# Patient Record
Sex: Male | Born: 1985 | Hispanic: No | Marital: Married | Smoking: Never smoker
Health system: Southern US, Community
[De-identification: ages and names within clinical notes are randomized; demographics above are authoritative.]

## PROBLEM LIST (undated history)

## (undated) DIAGNOSIS — D689 Coagulation defect, unspecified: Secondary | ICD-10-CM

## (undated) DIAGNOSIS — E079 Disorder of thyroid, unspecified: Secondary | ICD-10-CM

## (undated) DIAGNOSIS — T7840XA Allergy, unspecified, initial encounter: Secondary | ICD-10-CM

## (undated) HISTORY — DX: Coagulation defect, unspecified: D68.9

## (undated) HISTORY — DX: Disorder of thyroid, unspecified: E07.9

## (undated) HISTORY — DX: Allergy, unspecified, initial encounter: T78.40XA

---

## 2015-12-18 ENCOUNTER — Ambulatory Visit
Admission: RE | Admit: 2015-12-18 | Discharge: 2015-12-18 | Disposition: A | Discharge status: Home / Self Care | Payer: No Typology Code available for payment source | Source: Ambulatory Visit | Attending: Infectious Disease | Admitting: Infectious Disease

## 2015-12-18 ENCOUNTER — Other Ambulatory Visit: Payer: Self-pay | Admitting: Infectious Disease

## 2015-12-18 DIAGNOSIS — R7611 Nonspecific reaction to tuberculin skin test without active tuberculosis: Secondary | ICD-10-CM

## 2016-12-02 ENCOUNTER — Other Ambulatory Visit: Payer: Self-pay | Admitting: Nurse Practitioner

## 2016-12-02 DIAGNOSIS — R2232 Localized swelling, mass and lump, left upper limb: Secondary | ICD-10-CM

## 2016-12-06 ENCOUNTER — Other Ambulatory Visit: Payer: Self-pay

## 2018-04-02 ENCOUNTER — Ambulatory Visit: Payer: Self-pay | Admitting: Gastroenterology

## 2018-06-10 ENCOUNTER — Encounter: Payer: Self-pay | Admitting: Family

## 2019-06-03 ENCOUNTER — Ambulatory Visit: Payer: Self-pay | Attending: Family

## 2019-06-03 DIAGNOSIS — Z23 Encounter for immunization: Secondary | ICD-10-CM

## 2019-06-03 NOTE — Progress Notes (Signed)
   Covid-19 Vaccination Clinic  Name:  Lanard Arguijo    MRN: [id number] DOB: [birth year]  06/03/2019  Mr. Seals was observed post Covid-19 immunization for 15 minutes without incident. He was provided with Vaccine Information Sheet and instruction to access the V-Safe system.   Mr. Skalicky was instructed to call 911 with any severe reactions post vaccine: Marland Kitchen Difficulty breathing  . Swelling of face and throat  . A fast heartbeat  . A bad rash all over body  . Dizziness and weakness   Immunizations Administered    Name Date Dose VIS Date Route   Moderna COVID-19 Vaccine 06/03/2019  1:49 PM 0.5 mL 02/23/2019 Intramuscular   Manufacturer: Moderna   Lot: [id number]   NDC: 63016-010-93

## 2019-07-06 ENCOUNTER — Other Ambulatory Visit: Payer: Self-pay

## 2019-07-06 ENCOUNTER — Ambulatory Visit: Payer: Self-pay | Attending: Family

## 2019-07-06 DIAGNOSIS — Z23 Encounter for immunization: Secondary | ICD-10-CM

## 2019-07-06 NOTE — Progress Notes (Signed)
   Covid-19 Vaccination Clinic  Name:  Robert Macias    MRN: [id number] DOB: 06-07-1985  07/06/2019  [name] was observed post Covid-19 immunization for 15 minutes without incident. He was provided with Vaccine Information Sheet and instruction to access the V-Safe system.   [name] was instructed to call 911 with any severe reactions post vaccine: Marland Kitchen Difficulty breathing  . Swelling of face and throat  . A fast heartbeat  . A bad rash all over body  . Dizziness and weakness   Immunizations Administered    Name Date Dose VIS Date Route   Moderna COVID-19 Vaccine 07/06/2019  1:49 PM 0.5 mL 02/23/2019 Intramuscular   Manufacturer: Moderna   Lot: [id number]   NDC: 76151-834-37

## 2020-08-24 ENCOUNTER — Other Ambulatory Visit: Payer: Self-pay

## 2020-08-24 ENCOUNTER — Other Ambulatory Visit: Payer: Self-pay | Admitting: Internal Medicine

## 2020-08-24 ENCOUNTER — Ambulatory Visit
Admission: RE | Admit: 2020-08-24 | Discharge: 2020-08-24 | Disposition: A | Discharge status: Home / Self Care | Payer: BC Managed Care – PPO | Source: Ambulatory Visit | Attending: Internal Medicine | Admitting: Internal Medicine

## 2020-08-24 DIAGNOSIS — R7612 Nonspecific reaction to cell mediated immunity measurement of gamma interferon antigen response without active tuberculosis: Secondary | ICD-10-CM

## 2021-12-29 IMAGING — CR DG CHEST 2V
2 series · 2 of 2 positions shown · non-contrast
Comparison: Prior chest x-ray 12/18/2015

CLINICAL DATA: Positive TB blood test

EXAM:
CHEST - 2 VIEW

[w chest pa]
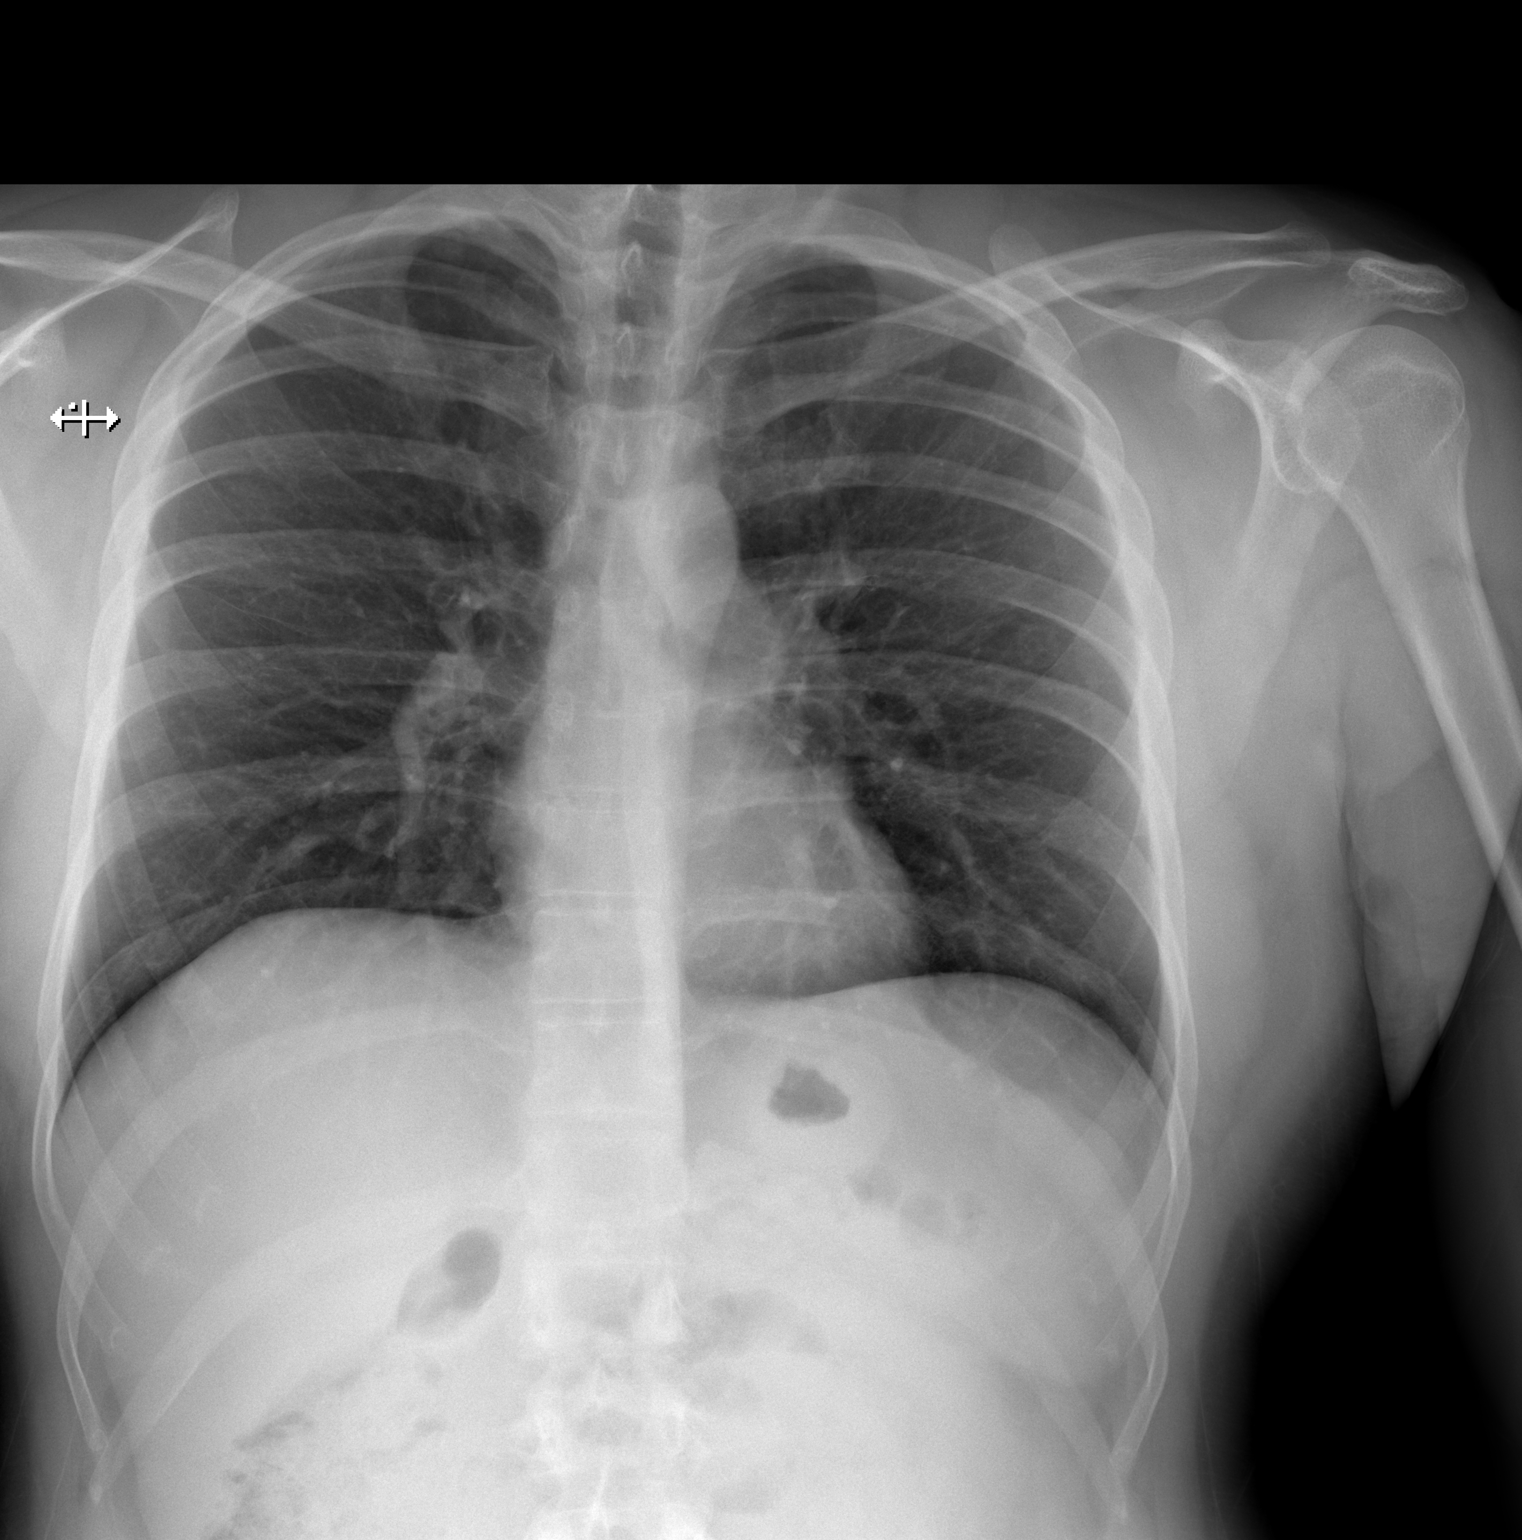

[w chest lat]
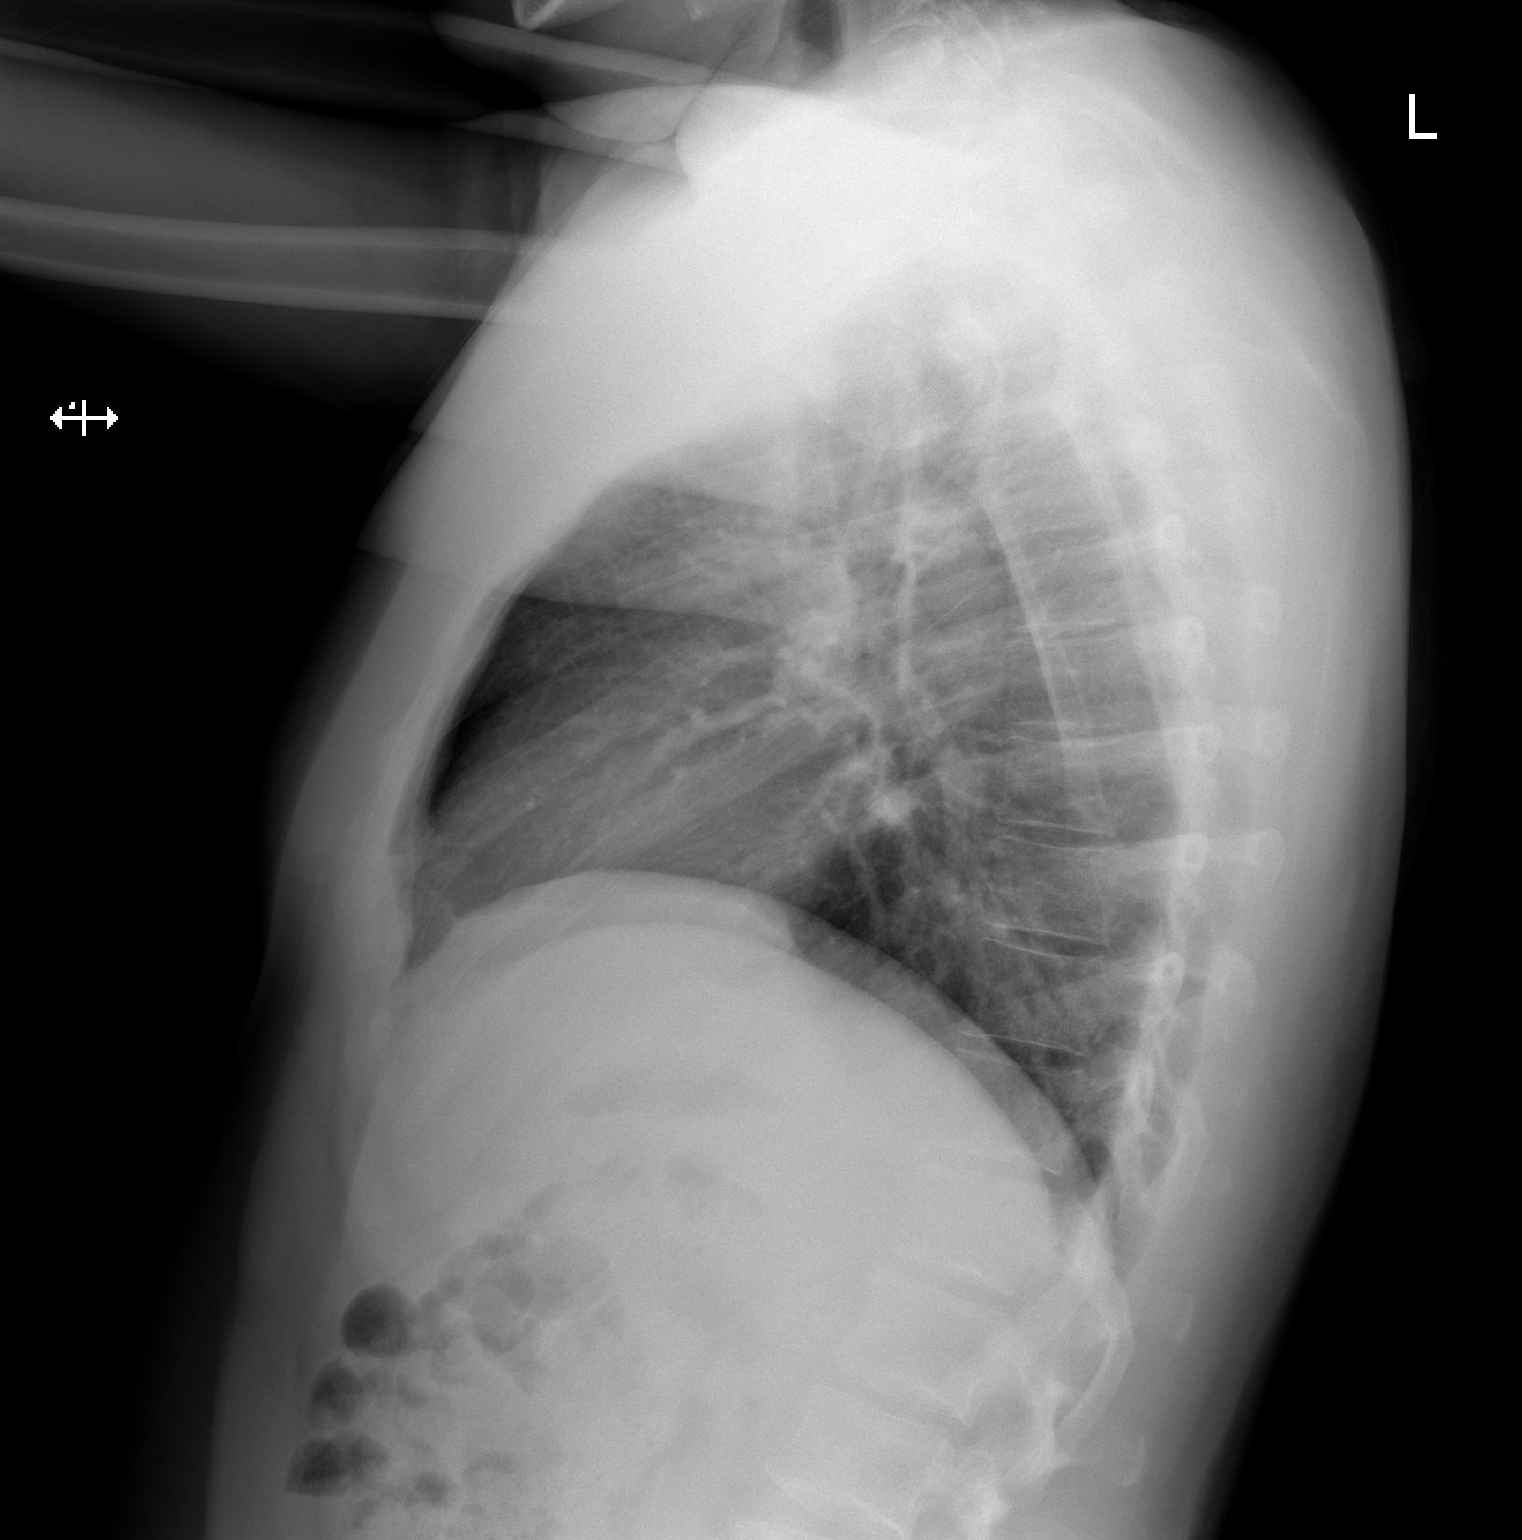

[2 of 2 positions shown; findings below may reference images not displayed]

FINDINGS: The lungs are clear and negative for focal airspace consolidation,
pulmonary edema or suspicious pulmonary nodule. No pleural effusion
or pneumothorax. Cardiac and mediastinal contours are within normal
limits. No acute fracture or lytic or blastic osseous lesions. The
visualized upper abdominal bowel gas pattern is unremarkable.
IMPRESSION: Normal chest x-ray. No imaging evidence of active or latent TB
infection.

## 2022-01-22 ENCOUNTER — Ambulatory Visit (HOSPITAL_BASED_OUTPATIENT_CLINIC_OR_DEPARTMENT_OTHER): Payer: BC Managed Care – PPO | Admitting: Nurse Practitioner

## 2022-01-28 ENCOUNTER — Encounter: Payer: Self-pay | Admitting: Internal Medicine

## 2022-01-28 ENCOUNTER — Ambulatory Visit (INDEPENDENT_AMBULATORY_CARE_PROVIDER_SITE_OTHER): Payer: 59 | Admitting: Internal Medicine

## 2022-01-28 VITALS — BP 115/79 | HR 96 | Temp 97.9°F | Resp 14 | Ht 69.0 in | Wt 199.2 lb

## 2022-01-28 DIAGNOSIS — G8929 Other chronic pain: Secondary | ICD-10-CM

## 2022-01-28 DIAGNOSIS — M25572 Pain in left ankle and joints of left foot: Secondary | ICD-10-CM | POA: Insufficient documentation

## 2022-01-28 DIAGNOSIS — Z8719 Personal history of other diseases of the digestive system: Secondary | ICD-10-CM

## 2022-01-28 DIAGNOSIS — E663 Overweight: Secondary | ICD-10-CM

## 2022-01-28 DIAGNOSIS — L649 Androgenic alopecia, unspecified: Secondary | ICD-10-CM | POA: Diagnosis not present

## 2022-01-28 DIAGNOSIS — E039 Hypothyroidism, unspecified: Secondary | ICD-10-CM | POA: Diagnosis not present

## 2022-01-28 HISTORY — DX: Personal history of other diseases of the digestive system: Z87.19

## 2022-01-28 HISTORY — DX: Androgenic alopecia, unspecified: L64.9

## 2022-01-28 LAB — COMPREHENSIVE METABOLIC PANEL
ALT: 21 U/L (ref 0–53)
AST: 22 U/L (ref 0–37)
Albumin: 4.6 g/dL (ref 3.5–5.2)
Alkaline Phosphatase: 51 U/L (ref 39–117)
BUN: 15 mg/dL (ref 6–23)
CO2: 28 mEq/L (ref 19–32)
Calcium: 9.7 mg/dL (ref 8.4–10.5)
Chloride: 101 mEq/L (ref 96–112)
Creatinine, Ser: 1.12 mg/dL (ref 0.40–1.50)
GFR: 83.47 mL/min (ref >60.00)
Glucose, Bld: 83 mg/dL (ref 70–99)
Potassium: 4.1 mEq/L (ref 3.5–5.1)
Sodium: 136 mEq/L (ref 135–145)
Total Bilirubin: 0.6 mg/dL (ref 0.2–1.2)
Total Protein: 7.7 g/dL (ref 6.0–8.3)

## 2022-01-28 LAB — CBC
HCT: 42.4 % (ref 39.0–52.0)
Hemoglobin: 14.7 g/dL (ref 13.0–17.0)
MCHC: 34.7 g/dL (ref 30.0–36.0)
MCV: 86.3 fl (ref 78.0–100.0)
Platelets: 170 10*3/uL (ref 150.0–400.0)
RBC: 4.91 Mil/uL (ref 4.22–5.81)
RDW: 12.9 % (ref 11.5–15.5)
WBC: 6.3 10*3/uL (ref 4.0–10.5)

## 2022-01-28 LAB — TSH: TSH: 1.81 u[IU]/mL (ref 0.35–5.50)

## 2022-01-28 LAB — T4, FREE: Free T4: 1.02 ng/dL (ref 0.60–1.60)

## 2022-01-28 NOTE — Progress Notes (Signed)
[facility] ---   Phone: [phone]  New patient visit  Visit Date: 01/28/2022 Patient: Robert Macias   DOB: Feb 23, 1986   36 y.o. Male  MRN: [id number]   Today's healthcare provider: Lula Olszewski, MD  Assessment and Plan:   Nicolas was seen today for establish care, wants to check  thyroxine levels, left ankle pain, discuss gi issue and alopecia.  Acquired hypothyroidism Overview: Started levothyroxine 2013 for elevated TSH of unknown cause.  Orders: -     TSH; Future -     T4, free  Chronic pain of left ankle Overview: On May 2023 he had an inversion sprain of the left ankle which left him with pain on the lateral malleolus that slowly resolved with rest but then was followed with inferior medial malleolus pain that is worsened by weightbearing and associated with numbness along the of the great toe  Assessment & Plan: We discussed the I do not think an x-ray is likely to add much value and that I think probably just physical therapy and B12 is the best path and since he is on the high deductible plan I will just give him some handouts on the kind of physical therapy to do on his own but if he decides to get the vasectomy and colonoscopy then he can call and I will make an official physical therapy referral to get more tailored treatment I also suggest vit d and calcium supplementation.   History of gastrointestinal bleeding Overview: Seen by GI in march 2023, recommended to have colonoscopy Problem went away, it was expensive and he is on high deductible plan   Assessment & Plan: We discussed the GI bleeding is not happening very often and so I explained the signs and symptoms of anemia that he would have to go to the ER which would be very expensive and he will will try to get his insurance changed so that we never end up in that situation and he can get an outpatient colonoscopy  Discuss it more with spouse and we talked about the insurance  issues really the only thing holding it up - he will call   Orders: -     CBC; Future -     Comprehensive metabolic panel; Future  Male pattern alopecia Assessment & Plan: We discussed 5 minutes benefits / side fx rogaine and propecia.    Overweight Overview: Encouraged diet/exercise. Goal bmi under 25 Goal waist circumference: 32-34.    Health Maintenance  Topic Date Due   HIV Screening  Never done   Hepatitis C Screening  Never done   TETANUS/TDAP  Never done   COVID-19 Vaccine (3 - Moderna series) 08/31/2019   INFLUENZA VACCINE  Never done   HPV VACCINES  Aged Out  Did not order HIV/hep c due to low risk and he is on high deductible health plan.  Subjective:  Patient presents today to establish care.  [facility]  Chief Complaint  Patient presents with   Establish Care   Wants to check  thyroxine levels    Medication dosage may need to be adjusted.   Left ankle pain    Mild pain for about the last four or five months.   Discuss GI issue    Intermittent, bleeding in stools.   Alopecia    Thinks it's related to the levothyroxine medication.    For history taking, I took a per problem history from the patient and chart review  as follows: Problem  Hypothyroid   Started levothyroxine 2013 for elevated TSH of unknown cause.   Left Ankle Pain   On May 2023 he had an inversion sprain of the left ankle which left him with pain on the lateral malleolus that slowly resolved with rest but then was followed with inferior medial malleolus pain that is worsened by weightbearing and associated with numbness along the of the great toe   History of Gastrointestinal Bleeding   Seen by GI in march 2023, recommended to have colonoscopy Problem went away, it was expensive and he is on high deductible plan    Male Pattern Alopecia  Overweight   Encouraged diet/exercise. Goal bmi under 25 Goal waist circumference: 32-34.      Depression  Screen     No data to display         No results found for any visits on 01/28/22.   The following were reviewed and entered/updated in epic: Past Medical History:  Diagnosis Date   Allergy pollen allergies   Eve irritation   Clotting disorder (HCC) 2010   Hemorrhoid   History of gastrointestinal bleeding 01/28/2022   Seen by GI in march 2023, recommended to have colonoscopy Problem went away, it was expensive and he is on high deductible plan    Male pattern alopecia 01/28/2022   Thyroid disease    History reviewed. No pertinent surgical history. History reviewed. No pertinent surgical history. Family Status  Relation Name Status   Mother Mom (Not Specified)   Father dad (Not Specified)   Son Son (Not Specified)   Mat Aunt mom's sister (Not Specified)   Mat Uncle Mom's brother (Not Specified)   MGM Grand mom (Not Specified)   MGF Grand Dad (Not Specified)   PGM Grand Mom (Not Specified)   PGF  (Not Specified)   Family History  Problem Relation Age of Onset   Hyperlipidemia Mother    Heart disease Mother    Congenital heart disease Mother    Hearing loss Father    Kidney disease Father    Learning disabilities Son    Diabetes Maternal Aunt    Stroke Maternal Uncle    Diabetes Maternal Grandmother    Cancer Maternal Grandfather    Stroke Paternal Grandmother    Kidney disease Paternal Grandfather    Outpatient Medications Prior to Visit  Medication Sig Dispense Refill   levothyroxine (SYNTHROID) 88 MCG tablet Take 88 mcg by mouth daily before breakfast.     No facility-administered medications prior to visit.    Allergies  Allergen Reactions   Advil [Ibuprofen]     Affects ability to sleep.   Social History   Tobacco Use   Smoking status: Never   Smokeless tobacco: Never   Tobacco comments:    never  Vaping Use   Vaping Use: Never used  Substance Use Topics   Alcohol use: Never   Drug use: Never    Immunization History  Administered Date(s)  Administered   Moderna Sars-Covid-2 Vaccination 06/03/2019, 07/06/2019    Review of Systems  Constitutional:  Negative for chills, diaphoresis, fever, malaise/fatigue and weight loss.  HENT:  Negative for congestion, ear discharge, ear pain, hearing loss, nosebleeds, sinus pain, sore throat and tinnitus.   Eyes:  Negative for blurred vision, double vision, photophobia, pain, discharge and redness.  Respiratory:  Negative for cough, hemoptysis, sputum production, shortness of breath, wheezing and stridor.   Cardiovascular:  Negative for chest pain, palpitations, orthopnea, claudication, leg swelling  and PND.  Gastrointestinal:  Positive for blood in stool. Negative for abdominal pain, constipation, diarrhea, heartburn, melena, nausea and vomiting.  Genitourinary:  Negative for dysuria, flank pain, frequency, hematuria and urgency.  Musculoskeletal:  Positive for joint pain. Negative for back pain, falls, myalgias and neck pain.  Skin:  Negative for itching and rash.  Neurological:  Negative for dizziness, tingling, tremors, sensory change, speech change, focal weakness, seizures, loss of consciousness, weakness and headaches.  Endo/Heme/Allergies:  Negative for environmental allergies and polydipsia. Does not bruise/bleed easily.  Psychiatric/Behavioral:  Negative for depression, hallucinations, memory loss, substance abuse and suicidal ideas. The patient is not nervous/anxious and does not have insomnia.      Objective:  BP 115/79 (BP Location: Left Arm, Patient Position: Sitting)   Pulse 96   Temp 97.9 F (36.6 C) (Temporal)   Resp 14   Ht 5\' 9"  (1.753 m)   Wt 199 lb 3.2 oz (90.4 kg)   SpO2 98%   BMI 29.42 kg/m  Body mass index is 29.42 kg/m.  He  is a very cordial and polite person who was a pleasure to meet.   Physical Exam Constitutional:      General: He is awake. He is not in acute distress.    Appearance: Normal appearance. He is well-developed. He is not ill-appearing,  toxic-appearing or diaphoretic.  HENT:     Head: Normocephalic and atraumatic.     Nose: Nose normal.  Eyes:     General: Lids are normal. No scleral icterus.    Conjunctiva/sclera: Conjunctivae normal.  Cardiovascular:     Rate and Rhythm: Normal rate and regular rhythm.     Heart sounds: No murmur heard.    No friction rub. No gallop.  Pulmonary:     Effort: Pulmonary effort is normal. No respiratory distress or retractions.     Breath sounds: Normal breath sounds.  Abdominal:     General: There is no distension.     Palpations: There is no mass.     Tenderness: There is no abdominal tenderness.  Neurological:     General: No focal deficit present.     Mental Status: He is alert.  Psychiatric:        Mood and Affect: Mood normal.        Behavior: Behavior normal.        Thought Content: Thought content normal.        Judgment: Judgment normal.

## 2022-01-28 NOTE — Assessment & Plan Note (Signed)
We discussed the GI bleeding is not happening very often and so I explained the signs and symptoms of anemia that he would have to go to the ER which would be very expensive and he will will try to get his insurance changed so that we never end up in that situation and he can get an outpatient colonoscopy  Discuss it more with spouse and we talked about the insurance issues really the only thing holding it up - he will call

## 2022-01-28 NOTE — Assessment & Plan Note (Signed)
We discussed 5 minutes benefits / side fx rogaine and propecia.

## 2022-01-28 NOTE — Progress Notes (Signed)
Everything normal notify.  Therefore I recommend staying on same dose synthroid, please refill 90d 3rf for him.  Happy all labs look good.  Balding is definitely not limited to a thyroid issue... its genetic.

## 2022-01-28 NOTE — Patient Instructions (Addendum)
It was a pleasure seeing you today! I truly hope you feel like you received 5 star service and please let me know if there is anything I can improve.  Lula Olszewski, MD   Today the plan is... Have a talk with your spouse about your plan for contraception and GI bleeding work-up and if you decide you want to call back for a urology referral and a gastroenterology referral  We will check Lab work and check on your thyroid and some other stuff make sure you are not severely anemic and communicate with you about the thyroid medicine by MyChart   Also monitor yourself for signs and symptoms of anemia with the following data and try the ankle exercises you would also be okay to try an ankle brace or to take weight off the ankle for a little while to see if that helps or take ibuprofen now and then.  Now that you have been diagnosed with worsening anemia, it's important to be aware of the following symptoms that may indicate a need for immediate medical attention:  Extreme fatigue or weakness Shortness of breath Rapid or irregular heartbeat Chest pain Dizziness or lightheadedness Pale or yellowish skin Cold hands and feet Headaches  Emergency symptoms specific to anemia include: A feeling that you're going to pass out Low blood pressure Low blood oxygen saturation, which may be measured with a pulse oximeter at home3 Loss of consciousness Please advise the patient to seek emergency care if they experience any of these symptoms, or any other symptoms that cause concern.  It's always better to be safe and get checked out by a healthcare professional if you are unsure.    When to recheck the anemia depends on how you are feeling... but if you feel fine and dont see any significant bleeding, then I'd suggest doing it in about a month.  In the meantime be sure to consistently check toilet to assess your bleeding and discuss whether to take blood thinners with me.      Ankle Exercises Ask  your health care provider which exercises are safe for you. Do exercises exactly as told by your health care provider and adjust them as directed. It is normal to feel mild stretching, pulling, tightness, or discomfort as you do these exercises. Stop right away if you feel sudden pain or your pain gets worse. Do not begin these exercises until told by your health care provider. Stretching and range-of-motion exercises These exercises warm up your muscles and joints. They can help improve the movement and flexibility of your ankle. They may also help to relieve pain. Dorsiflexion/plantar flexion  Sit with your left / right knee straight or bent. Do not rest your foot on anything. Flex your left / right ankle to tilt the top of your foot toward your shin. This is called dorsiflexion. Hold this position for __________ seconds. Point your toes downward to tilt the top of your foot away from your shin. This is called plantar flexion. Hold this position for __________ seconds. Repeat __________ times. Complete this exercise __________ times a day. Ankle alphabet  Sit with your left / right foot supported at your lower leg. Do not rest your foot on anything. Make sure your foot has room to move freely. Think of your left / right foot as a paintbrush: Move your foot to trace each letter of the alphabet in the air. Keep your hip and knee still while you trace the letters. Make the letters as  large as you can without causing or increasing any discomfort. Repeat __________ times. Complete this exercise __________ times a day. Passive ankle dorsiflexion This is an exercise in which something or someone moves your ankle for you. Sit in a chair on a non-carpeted surface. Place your left / right foot on the floor, directly under your left / right knee. Extend your left / right leg for support. Keeping your heel down, slide your left / right foot back toward the chair until you feel a stretch at your ankle  or calf. If you do not feel a stretch, slide your buttocks forward to the edge of the chair while keeping your heel down. Hold this stretch for __________ seconds. Repeat __________ times. Complete this exercise __________ times a day. Strengthening exercises These exercises build strength and endurance in your ankle. Endurance is the ability to use your muscles for a long time, even after they get tired. Dorsiflexors These are muscles that lift your foot up. Secure a rubber exercise band or tube to an object, such as a table leg, that will stay still when the band is pulled. Secure the other end around your left / right foot. Sit on the floor. Face the object with your left / right leg extended. The band or tube should be slightly tense when your foot is relaxed. Slowly flex your left / right ankle and toes to bring your foot toward your shin. Hold this position for __________ seconds. Slowly return your foot to the starting position, controlling the band as you do. Repeat __________ times. Complete this exercise __________ times a day. Plantar flexors These are muscles that push your foot down. Sit on the floor with your left / right leg extended. Loop a rubber exercise band or tube around the ball of your left / right foot. The ball of your foot is on the walking surface, right under your toes. The band or tube should be slightly tense when your foot is relaxed. Slowly point your toes downward, pushing them away from you. Hold this position for __________ seconds. Slowly release the tension in the band or tube, controlling smoothly until your foot is back in the starting position. Repeat __________ times. Complete this exercise __________ times a day. Towel curls  Sit in a chair on a non-carpeted surface. Put your feet on the floor. Place a towel in front of your feet. If told by your health care provider, add a __________ lb / kg weight to the end of the towel. Keeping your heel on the  floor, put your left / right foot on the towel. Pull the towel toward you by grabbing the towel with your toes and curling them under. Keep your heel on the floor. Let your toes relax. Grab the towel again. Keep pulling the towel until it is completely underneath your foot. Repeat __________ times. Complete this exercise __________ times a day. Standing plantar flexion This is an exercise in which you use your toes to lift your body's weight while standing. Stand with your feet shoulder-width apart. Keep your weight spread evenly over the width of your feet while you rise up on your toes. Use a wall or table to steady yourself if needed, but try not to use it for support. If this exercise is too easy, try these options: Shift your weight toward your left / right leg until you feel challenged. If told by your health care provider, lift your uninjured leg off the floor. Hold this position for  __________ seconds. Repeat __________ times. Complete this exercise __________ times a day. Tandem walking  Stand with one foot directly in front of the other. Slowly raise your back foot up, lifting your heel before your toes, and place it directly in front of your other foot. Continue to walk in this heel-to-toe way for __________ or for as long as told by your health care provider. Have a countertop or wall nearby to use if needed to keep your balance, but try not to hold onto anything for support. Repeat __________ times. Complete this exercise __________ times a day. This information is not intended to replace advice given to you by your health care provider. Make sure you discuss any questions you have with your health care provider. Document Revised: 06/19/2021 Document Reviewed: 06/19/2021 Elsevier Patient Education  [place].    There are no diagnoses linked to this encounter.     [x]  No follow-ups on file.   [x]  If your condition begins to worsen or become severe:  GO to the  ER  [x]  If you are not doing well: RETURN to the office sooner  [x]  If you have follow-up questions / concerns: please contact me  -via phone [phone] OR MyChart messaging   [x]  Please bring all your medicines to each appointment  [x]   You will be contacted with the lab results as soon as they are available. For any labs or imaging tests, we will call you if the results are significantly abnormal.  Most normal results will be posted to myChart as soon as they are available and I will comment on them there within 2-3 business days.  [x]  Billing questions for xray and lab orders are billed from separate companies and questions/concerns should be directed to the [facility].  For visit charges please discuss with our administrative services.  [x]  You will be contacted with the lab results as soon as they are available. The fastest way to get your results is to activate your My Chart account. Instructions are located on the last page of this paperwork. If you have not heard from [place] regarding the results in 2 weeks, please contact this office.

## 2022-01-28 NOTE — Assessment & Plan Note (Signed)
We discussed the I do not think an x-ray is likely to add much value and that I think probably just physical therapy and B12 is the best path and since he is on the high deductible plan I will just give him some handouts on the kind of physical therapy to do on his own but if he decides to get the vasectomy and colonoscopy then he can call and I will make an official physical therapy referral to get more tailored treatment I also suggest vit d and calcium supplementation.

## 2022-01-30 ENCOUNTER — Other Ambulatory Visit: Payer: Self-pay

## 2022-01-30 ENCOUNTER — Telehealth: Payer: Self-pay | Admitting: Internal Medicine

## 2022-01-30 NOTE — Telephone Encounter (Signed)
Pt called back re lab results - patient does not have vm set up - office can call him anytime after 2 pm today -

## 2022-02-04 ENCOUNTER — Other Ambulatory Visit: Payer: Self-pay

## 2022-02-04 MED ORDER — LEVOTHYROXINE SODIUM 88 MCG PO TABS: 88.0000 ug | ORAL_TABLET | Freq: Every day | ORAL | 3 refills | Status: DC

## 2022-02-04 NOTE — Telephone Encounter (Signed)
Spoke with pt and advised lab results, pt verbalized understanding.  

## 2022-03-07 ENCOUNTER — Encounter: Payer: Self-pay | Admitting: *Deleted

## 2022-08-02 ENCOUNTER — Ambulatory Visit (INDEPENDENT_AMBULATORY_CARE_PROVIDER_SITE_OTHER): Payer: 59 | Admitting: Internal Medicine

## 2022-08-02 ENCOUNTER — Encounter: Payer: Self-pay | Admitting: Internal Medicine

## 2022-08-02 VITALS — BP 123/84 | HR 95 | Temp 98.2°F | Ht 69.0 in | Wt 196.4 lb

## 2022-08-02 DIAGNOSIS — R6889 Other general symptoms and signs: Secondary | ICD-10-CM | POA: Diagnosis not present

## 2022-08-02 DIAGNOSIS — R051 Acute cough: Secondary | ICD-10-CM

## 2022-08-02 DIAGNOSIS — J029 Acute pharyngitis, unspecified: Secondary | ICD-10-CM

## 2022-08-02 DIAGNOSIS — U071 COVID-19: Secondary | ICD-10-CM

## 2022-08-02 DIAGNOSIS — R0981 Nasal congestion: Secondary | ICD-10-CM | POA: Diagnosis not present

## 2022-08-02 HISTORY — DX: COVID-19: U07.1

## 2022-08-02 LAB — POC COVID19 BINAXNOW: SARS Coronavirus 2 Ag: POSITIVE — AB

## 2022-08-02 LAB — POCT INFLUENZA A/B
Influenza A, POC: NEGATIVE
Influenza B, POC: NEGATIVE

## 2022-08-02 LAB — POCT RAPID STREP A (OFFICE): Rapid Strep A Screen: POSITIVE — AB

## 2022-08-02 MED ORDER — NIRMATRELVIR/RITONAVIR (PAXLOVID)TABLET: 3.0000 | ORAL_TABLET | Freq: Two times a day (BID) | ORAL | 0 refills | Status: AC

## 2022-08-02 NOTE — Patient Instructions (Signed)
It was a pleasure seeing you today! Your health and satisfaction are our top priorities.   Glenetta Hew, MD  Next Steps:  [x]  Flexible Follow-Up: We recommend a follow up with your primary care, a specialist, or me within 1-2 weeks for ensuring your problem resolves. This allows for progress monitoring and treatment adjustments. [x]  Early Intervention: Schedule sooner appointment, call our on-call services, or go to emergency room if there is Increase in pain or discomfort New or worsening symptoms Sudden or severe changes in your health [x]  Lab & X-ray Appointments: complete or schedule to complete today, or call to schedule.  X-rays: [facility] (M-F, 8:30am-noon or 1pm-5pm).  Making the Most of Our Focused (20 minute) Follow Up Appointments:  [x]   Clearly state your top concerns at the beginning of the visit to focus our discussion [x]   If you anticipate you will need more time, please inform the front desk during scheduling - we can book multiple appointments in the same week. [x]   If you have transportation problems- use our convenient video appointments or ask about transportation support. [x]   We can get down to business faster if you use MyChart to update information before the visit and submit non-urgent questions before your visit. Thank you for taking the time to provide details through MyChart.  Let our nurse know and she can import this information into your encounter documents.  Arrival and Wait Times: [x]   Arriving on time ensures that everyone receives prompt attention. [x]   Early morning (8a) and afternoon (1p) appointments tend to have shortest wait times. [x]   Unfortunately, we cannot delay appointments for late arrivals or hold slots during phone calls.  Getting Answers and Following Up  [x]   Simple Questions & Concerns: For quick questions or basic follow-up after your visit, reach [place] at [phone] or MyChart messaging. [x]   Complex Concerns: If  your concern is more complex, scheduling an appointment might be best. Discuss this with the staff to find the most suitable option. [x]   Lab & Imaging Results: We'll contact you directly if results are abnormal or you don't use MyChart. Most normal results will be on MyChart within 2-3 business days, with a review message from Dr. Jon Billings. Haven't heard back in 2 weeks? Need results sooner? Contact us at [phone]. [x]   Referrals: Our referral coordinator will manage specialist referrals. The specialist's office should contact you within 2 weeks to schedule an appointment. Call us if you haven't heard from them after 2 weeks.  Staying Connected  [x]   MyChart: Activate your MyChart for the fastest way to access results and message [place]. See the last page of this paperwork for instructions on how to activate.  Bring to Your Next Appointment  [x]   Medications: Please bring all your medication bottles to your next appointment to ensure we have an accurate record of your prescriptions. [x]   Health Diaries: If you're monitoring any health conditions at home, keeping a diary of your readings can be very helpful for discussions at your next appointment.  Billing  [x]   X-ray & Lab Orders: These are billed by separate companies. Contact the invoicing company directly for questions or concerns. [x]   Visit Charges: Discuss any billing inquiries with our administrative services team.  Your Satisfaction Matters  [x]   Share Your Experience: We strive for your satisfaction! If you have any complaints, or preferably compliments, please let Dr. Jon Billings know directly or contact our Practice Administrators, Edwena Felty or [employer], by  asking at the front desk.   Reviewing Your Records  [x]   Review this early draft of your clinical encounter notes below and the final encounter summary tomorrow on MyChart after its been completed.   Sore throat -     POCT rapid strep A  Flu-like symptoms -      POCT Influenza A/B  Acute cough -     POC COVID-19 BinaxNow  Nasal congestion -     POC COVID-19 BinaxNow  COVID -     nirmatrelvir/ritonavir; Take 3 tablets by mouth 2 (two) times daily for 5 days. (Take nirmatrelvir 150 mg two tablets twice daily for 5 days and ritonavir 100 mg one tablet twice daily for 5 days) Patient GFR is over 60  Dispense: 30 tablet; Refill: 0

## 2022-08-02 NOTE — Assessment & Plan Note (Addendum)
Assessment: The patient has tested positive for COVID-19 with a strong positive result. A faint positive result for strep was also observed, but given the strong positive result for COVID-19 and our office's history of frequent false-positive strep rapid tests, this was dismissed as a likely false positive.  Plan: We discussed the potential use of Paxlovid as a treatment option and provided the patient with After Visit Summary (AVS) data. We also discussed the importance of quarantining to prevent the spread of the virus.   The patient was advised to contact the on-call healthcare provider if their condition does not improve soon, as there is still a possibility of a strep infection, albeit doubtful.   We also discussed recent changes to the CDC guidelines. The patient was given a note for a day off work. If the patient recovers well, I advised that I would release them to return to work without requiring a repeat visit.

## 2022-08-02 NOTE — Progress Notes (Signed)
[facility]: [phone]   Routine Medical Office Visit  Patient:  Robert Macias      Age: 37 y.o.       Sex:  male  Date:   08/02/2022 PCP:    Lula Olszewski, MD   Today's Healthcare Provider: Lula Olszewski, MD   Assessment and Plan:   COVID Assessment & Plan: Assessment: The patient has tested positive for COVID-19 with a strong positive result. A faint positive result for strep was also observed, but given the strong positive result for COVID-19 and our office's history of frequent false-positive strep rapid tests, this was dismissed as a likely false positive.  Plan: We discussed the potential use of Paxlovid as a treatment option and provided the patient with After Visit Summary (AVS) data. We also discussed the importance of quarantining to prevent the spread of the virus.   The patient was advised to contact the on-call healthcare provider if their condition does not improve soon, as there is still a possibility of a strep infection, albeit doubtful.   We also discussed recent changes to the CDC guidelines. The patient was given a note for a day off work. If the patient recovers well, I advised that I would release them to return to work without requiring a repeat visit.  Orders: -     nirmatrelvir/ritonavir; Take 3 tablets by mouth 2 (two) times daily for 5 days. (Take nirmatrelvir 150 mg two tablets twice daily for 5 days and ritonavir 100 mg one tablet twice daily for 5 days) Patient GFR is over 60  Dispense: 30 tablet; Refill: 0  Sore throat -     POCT rapid strep A  Flu-like symptoms -     POCT Influenza A/B  Acute cough -     POC COVID-19 BinaxNow  Nasal congestion -     POC COVID-19 BinaxNow    Treatment plan discussed and reviewed in detail. Explained medication safety and potential side effects. Agreed on patient returning to office if symptoms worsen, persist, or new symptoms develop. Discussed precautions in case of needing to  visit the Emergency Department. Answered all patient questions and confirmed understanding and comfort with the plan. Encouraged patient to contact our office if they have any questions or concerns.      Clinical Presentation:     37 y.o. male here today for Fever (No fever currently. Symptoms started Monday or Tuesday. Took Tylenol with relief from fever only. ), Sore Throat, Cough (Producing small amount of yellowish mucus sometimes.), Nasal Congestion (Runny nose and sneezing.), Generalized Body Aches, and Chills  All symptom(s) in last 3-4 days  Fever  This is a new problem. The current episode started in the past 7 days. The problem has been waxing and waning. Associated symptoms include coughing.  Risk factors: sick contacts (all his kids have)   Sore Throat  Associated symptoms include coughing.  Cough The problem has been waxing and waning. The cough is Non-productive. Associated symptoms include a fever.  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   No facility-administered medications prior to visit.         Clinical Data Analysis:    Physical Exam  BP 123/84 (BP Location: Left Arm, Patient Position: Sitting)   Pulse 95   Temp 98.2 F (36.8 C) (Temporal)   Ht 5\' 9"  (1.753 m)   Wt 196 lb 6.4 oz (89.1 kg)   SpO2 96%   BMI  29.00 kg/m  Wt Readings from Last 10 Encounters:  08/02/22 196 lb 6.4 oz (89.1 kg)  01/28/22 199 lb 3.2 oz (90.4 kg)   Vital signs reviewed.  Nursing notes reviewed. Weight trend reviewed. Abnormalities and Problem-Specific physical exam findings:  wearing mask, no coughing and breathing pattern normal.  No sniffling during visit.  No diaphoresis.  Doesn't appear that ill.  General Appearance:  No acute distress appreciable.   Well-groomed, healthy-appearing male.  Well proportioned with no abnormal fat distribution.  Good muscle tone. Skin: Clear and well-hydrated. Pulmonary:  Normal work of breathing at rest, no respiratory distress apparent. SpO2: 96 %  Musculoskeletal: All extremities are intact.  Neurological:  Awake, alert, oriented, and engaged.  No obvious focal neurological deficits or cognitive impairments.  Sensorium seems unclouded.   Speech is clear and coherent with logical content. Psychiatric:  Appropriate mood, pleasant and cooperative demeanor, thoughtful and engaged during the exam   Additional Results Reviewed:     Results for orders placed or performed in visit on 08/02/22  POCT rapid strep A  Result Value Ref Range   Rapid Strep A Screen Positive (A) Negative  POC COVID-19  Result Value Ref Range   SARS Coronavirus 2 Ag Positive (A) Negative  POCT Influenza A/B  Result Value Ref Range   Influenza A, POC Negative Negative   Influenza B, POC Negative Negative    Recent Results (from the past 2160 hour(s))  POCT Influenza A/B     Status: Normal   Collection Time: 08/02/22 10:49 AM  Result Value Ref Range   Influenza A, POC Negative Negative   Influenza B, POC Negative Negative  POC COVID-19     Status: Abnormal   Collection Time: 08/02/22 10:50 AM  Result Value Ref Range   SARS Coronavirus 2 Ag Positive (A) Negative  POCT rapid strep A     Status: Abnormal   Collection Time: 08/02/22 10:50 AM  Result Value Ref Range   Rapid Strep A Screen Positive (A)  Negative    No image results found.   No results found.     Signed: Lula Olszewski, MD 08/02/2022 3:14 PM

## 2022-08-07 ENCOUNTER — Telehealth: Payer: Self-pay | Admitting: Internal Medicine

## 2022-08-07 ENCOUNTER — Encounter: Payer: Self-pay | Admitting: Internal Medicine

## 2022-08-09 NOTE — Telephone Encounter (Signed)
error 

## 2023-02-05 ENCOUNTER — Encounter: Payer: Self-pay | Admitting: Internal Medicine

## 2023-02-05 ENCOUNTER — Ambulatory Visit (INDEPENDENT_AMBULATORY_CARE_PROVIDER_SITE_OTHER): Payer: 59 | Admitting: Internal Medicine

## 2023-02-05 VITALS — BP 100/84 | HR 81 | Temp 98.0°F | Ht 69.0 in | Wt 203.0 lb

## 2023-02-05 DIAGNOSIS — E785 Hyperlipidemia, unspecified: Secondary | ICD-10-CM

## 2023-02-05 DIAGNOSIS — Z0001 Encounter for general adult medical examination with abnormal findings: Secondary | ICD-10-CM

## 2023-02-05 DIAGNOSIS — E663 Overweight: Secondary | ICD-10-CM | POA: Diagnosis not present

## 2023-02-05 DIAGNOSIS — K649 Unspecified hemorrhoids: Secondary | ICD-10-CM

## 2023-02-05 DIAGNOSIS — E65 Localized adiposity: Secondary | ICD-10-CM | POA: Insufficient documentation

## 2023-02-05 DIAGNOSIS — R1084 Generalized abdominal pain: Secondary | ICD-10-CM

## 2023-02-05 DIAGNOSIS — Z0000 Encounter for general adult medical examination without abnormal findings: Secondary | ICD-10-CM

## 2023-02-05 DIAGNOSIS — C4499 Other specified malignant neoplasm of skin, unspecified: Secondary | ICD-10-CM

## 2023-02-05 DIAGNOSIS — E039 Hypothyroidism, unspecified: Secondary | ICD-10-CM

## 2023-02-05 DIAGNOSIS — Z23 Encounter for immunization: Secondary | ICD-10-CM

## 2023-02-05 DIAGNOSIS — R41840 Attention and concentration deficit: Secondary | ICD-10-CM | POA: Insufficient documentation

## 2023-02-05 DIAGNOSIS — L649 Androgenic alopecia, unspecified: Secondary | ICD-10-CM | POA: Diagnosis not present

## 2023-02-05 DIAGNOSIS — Z8719 Personal history of other diseases of the digestive system: Secondary | ICD-10-CM | POA: Diagnosis not present

## 2023-02-05 DIAGNOSIS — G8929 Other chronic pain: Secondary | ICD-10-CM | POA: Insufficient documentation

## 2023-02-05 LAB — COMPREHENSIVE METABOLIC PANEL
ALT: 28 U/L (ref 0–53)
AST: 24 U/L (ref 0–37)
Albumin: 4.5 g/dL (ref 3.5–5.2)
Alkaline Phosphatase: 51 U/L (ref 39–117)
BUN: 15 mg/dL (ref 6–23)
CO2: 29 meq/L (ref 19–32)
Calcium: 9.6 mg/dL (ref 8.4–10.5)
Chloride: 102 meq/L (ref 96–112)
Creatinine, Ser: 1.24 mg/dL (ref 0.40–1.50)
GFR: 73.35 mL/min (ref >60.00)
Glucose, Bld: 92 mg/dL (ref 70–99)
Potassium: 3.8 meq/L (ref 3.5–5.1)
Sodium: 137 meq/L (ref 135–145)
Total Bilirubin: 0.6 mg/dL (ref 0.2–1.2)
Total Protein: 7.9 g/dL (ref 6.0–8.3)

## 2023-02-05 LAB — CBC WITH DIFFERENTIAL/PLATELET
Basophils Absolute: 0 10*3/uL (ref 0.0–0.1)
Basophils Relative: 0.8 % (ref 0.0–3.0)
Eosinophils Absolute: 0.2 10*3/uL (ref 0.0–0.7)
Eosinophils Relative: 3.8 % (ref 0.0–5.0)
HCT: 43.4 % (ref 39.0–52.0)
Hemoglobin: 15 g/dL (ref 13.0–17.0)
Lymphocytes Relative: 40.1 % (ref 12.0–46.0)
Lymphs Abs: 2.1 10*3/uL (ref 0.7–4.0)
MCHC: 34.6 g/dL (ref 30.0–36.0)
MCV: 87.1 fL (ref 78.0–100.0)
Monocytes Absolute: 0.3 10*3/uL (ref 0.1–1.0)
Monocytes Relative: 6.3 % (ref 3.0–12.0)
Neutro Abs: 2.6 10*3/uL (ref 1.4–7.7)
Neutrophils Relative %: 49 % (ref 43.0–77.0)
Platelets: 188 10*3/uL (ref 150.0–400.0)
RBC: 4.98 Mil/uL (ref 4.22–5.81)
RDW: 12.7 % (ref 11.5–15.5)
WBC: 5.3 10*3/uL (ref 4.0–10.5)

## 2023-02-05 LAB — LIPID PANEL
Cholesterol: 191 mg/dL (ref 0–200)
HDL: 34.7 mg/dL — ABNORMAL LOW (ref >39.00)
LDL Cholesterol: 135 mg/dL — ABNORMAL HIGH (ref 0–99)
NonHDL: 156.25
Total CHOL/HDL Ratio: 6
Triglycerides: 108 mg/dL (ref 0.0–149.0)
VLDL: 21.6 mg/dL (ref 0.0–40.0)

## 2023-02-05 LAB — HEMOGLOBIN A1C: Hgb A1c MFr Bld: 5.4 % (ref 4.6–6.5)

## 2023-02-05 NOTE — Assessment & Plan Note (Signed)
Was  He experiences intermittent rectal bleeding during defecation and has been diagnosed with hemorrhoids by gastroenterology. We discussed management options including a high-fiber diet and provided handouts on managing hemorrhoids. We offered a referral to a hemorrhoid surgeon if symptoms worsen.

## 2023-02-05 NOTE — Patient Instructions (Addendum)
Managing Your Health Over Time  Managing every aspect of your health in a single visit isn't always feasible, but that's okay.  We addressed many preventive issues today and charted a course for future care. Acute conditions or preventive care measures may require further attention.  We encourage you to schedule a follow-up visit at your earliest convenience to discuss any unresolved issues.  We strongly encourage continued participation in annual preventive care visits to help [place] develop a more thorough understanding of your health and to help you maintain optimal wellness - please inquire about scheduling your next one with [place] at your earliest convenience.   VISIT SUMMARY:  During your annual check-up, we discussed various health concerns and preventive measures. You reported occasional abdominal pain, rectal bleeding, and concentration issues. We also reviewed your thyroid management and skin lesions. We discussed lifestyle habits and recommended screenings for potential health issues.  YOUR PLAN:  -ANNUAL PHYSICAL EXAM: Regular check-ups are essential for maintaining overall health. We discussed the importance of regular dental and eye exams, and recommended using saline nasal spray to reduce the risk of allergies and infections.  -SNORING AND POSSIBLE SLEEP APNEA: Snoring and occasional daytime fatigue may indicate sleep-disordered breathing, which can affect your overall health. We recommended using the [facility] app for self-assessment and considering a sleep study if needed. Daily exercise and a heart-healthy diet were also advised.  -THYROID HORMONE MANAGEMENT: Thyroid hormone levels need to be managed to prevent symptoms like weight gain and hair loss. We will order advanced thyroid testing and discussed potential hair replacement therapies.  -HEMORRHOIDS: Hemorrhoids are swollen veins in the lower rectum that can cause bleeding. We recommended a high-fiber diet and provided information on  managing hemorrhoids. A referral to a hemorrhoid surgeon was offered if symptoms worsen.  -FATTY LIVER SCREENING: Fatty liver disease is the buildup of fat in the liver, often related to diet and lifestyle. We will order an abdominal ultrasound and liver function tests to screen for fatty liver and evaluate your abdominal pain.  -CONCENTRATION ISSUES: Difficulty concentrating can be related to stress or other conditions like attention deficit disorder (ADD). We discussed the potential for ADD evaluation and encouraged using the [facility] app to rule out sleep-related causes.  -GENERAL HEALTH MAINTENANCE: Maintaining good health involves regular exercise, a heart-healthy diet, and avoiding smoking, vaping, and drug use. We recommended a tetanus vaccination and discussed vasectomy as a long-term contraceptive option.  INSTRUCTIONS:  Please schedule your dental and eye exams as planned. We will follow up with advanced thyroid testing, an abdominal ultrasound, and liver function tests. Use the [facility] app to monitor your snoring. If your symptoms worsen or new concerns arise, please schedule a follow-up appointment. Your next annual check-up is already scheduled.  Next Steps  [x]   Schedule Follow-Up:  We recommend a follow-up appointment in 1 year for your next wellness visit.  If you develop any new problems, want to address any medical issues, or your condition worsens before then, please call us for an appointment or seek emergency care. [x]   Preventive Care:  Make sure to keep regular appointments with dental and vision professionals, use nightly nasal saline mist sprays to keep your sinuses clear and toothbrushing to protect your teeth. Use [facility] App or other app to track your sleep quality. Check blood pressure and heart rate routinely. [x]   Medical Information Release:  For any relevant medical information we don't have, please sign a release form at the front desk  so we can obtain it for  your records. [x]   Lab Tests:  Schedule any lab tests from today for within a week to ensure best insurance coverage.    Making the Most of Our Focused (20 minute) Appointments:  [x]   Clearly state your top concerns at the beginning of the visit to focus our discussion [x]   If you anticipate you will need more time, please inform the front desk during scheduling - we can book multiple appointments in the same week. [x]   If you have transportation problems- use our convenient video appointments or ask about transportation support. [x]   We can get down to business faster if you use MyChart to update information before the visit and submit non-urgent questions before your visit. Thank you for taking the time to provide details through MyChart.  Let our nurse know and she can import this information into your encounter documents.  Arrival and Wait Times: [x]   Arriving on time ensures that everyone receives prompt attention. [x]   Early morning (8a) and afternoon (1p) appointments tend to have shortest wait times. [x]   Unfortunately, we cannot delay appointments for late arrivals or hold slots during phone calls.  Bring to Your Next Appointment  [x]   Medications: Please bring all your medication bottles to your next appointment to ensure we have an accurate record of your prescriptions. [x]   Health Diaries: If you're monitoring any health conditions at home, keeping a diary of your readings can be very helpful for discussions at your next appointment.  Reviewing Your Records  [x]   Review your attached preventive care information at the end of these patient instructions. [x]   Review this early draft of your clinical encounter notes below and the final encounter summary tomorrow on MyChart after its been completed.   Encounter for annual health examination  Encounter for annual general medical examination with abnormal findings in adult  Preventative health care  Healthcare  maintenance  Overweight  Male pattern alopecia  Acquired hypothyroidism  History of gastrointestinal bleeding  Need for influenza vaccination -     Flu vaccine trivalent PF, 6mos and older(Flulaval,Afluria,Fluarix,Fluzone)  Bleeding hemorrhoid  Abdominal pain, chronic, generalized  Central adiposity     Getting Answers and Following Up  [x]   Simple Questions & Concerns: For quick questions or basic follow-up after your visit, reach [place] at [phone] or MyChart messaging. [x]   Complex Concerns: If your concern is more complex, scheduling an appointment might be best. Discuss this with the staff to find the most suitable option. [x]   Lab & Imaging Results: We'll contact you directly if results are abnormal or you don't use MyChart. Most normal results will be on MyChart within 2-3 business days, with a review message from Dr. Jon Billings. Haven't heard back in 2 weeks? Need results sooner? Contact us at [phone]. [x]   Referrals: Our referral coordinator will manage specialist referrals. The specialist's office should contact you within 2 weeks to schedule an appointment. Call us if you haven't heard from them after 2 weeks.  Staying Connected  [x]   MyChart: Activate your MyChart for the fastest way to access results and message [place]. See the last page of this paperwork for instructions on how to activate.  Billing  [x]   X-ray & Lab Orders: These are billed by separate companies. Contact the invoicing company directly for questions or concerns. [x]   Visit Charges: Discuss any billing inquiries with our administrative services team.  Your Satisfaction Matters  [x]   Share Your Experience: We strive  for your satisfaction! If you have any complaints, or preferably compliments, please let Dr. Jon Billings know directly or contact our Practice Administrators, Edwena Felty or [employer], by asking at the front desk.

## 2023-02-05 NOTE — Progress Notes (Signed)
[facility]: [phone]   -- Annual Preventive Medical Office Visit --  Patient:  Robert Macias      Age: 37 y.o.       Sex:  male  Date:   02/05/2023 Patient Care Team: Lula Olszewski, MD as PCP - General (Internal Medicine) Today's Healthcare Provider: Lula Olszewski, MD  ========================================= Chief Complaint  Patient presents with  . Annual Exam   Purpose of Visit: Comprehensive preventive health assessment and personalized health maintenance planning.  This encounter was conducted as a Comprehensive Physical Exam (CPE) preventive care annual visit. The patient's medical history and problem list were reviewed to inform individualized preventive care recommendations.     Assessment & Plan Encounter for annual health examination Annual Physical Exam   During his routine annual physical examination, we discussed the importance of regular check-ups with ophthalmologists and dentists. We recommended using saline nasal spray for sinus rinsing to reduce the risk of allergies and infections.  General Health Maintenance   We recommended a tetanus vaccination, regular exercise, and a heart-healthy diet. We advised against smoking, vaping, and drug use. We discussed vasectomy as a long-term contraceptive option, including its lower risk compared to male sterilization and insurance coverage.  Follow-up   We scheduled his next annual follow-up and encouraged follow-up if any symptoms worsen or new concerns arise.  Encounter for annual general medical examination with abnormal findings in adult See extensive AVS info given. Preventative health care  Healthcare maintenance  Overweight  Fatty Liver Screening   He expressed concern about potential fatty liver disease due to dietary habits and occasional mild abdominal pain. We discussed the importance of staying lean and the role of exercise and diet in preventing fatty liver disease. We  will order an abdominal ultrasound for screening of fatty liver and evaluation of abdominal pain and check liver function tests in blood work. Male pattern alopecia  Acquired hypothyroidism Thyroid Hormone Management   We are managing his ongoing thyroid hormone levels, with symptoms including slight weight gain and hair loss but no palpitations. We discussed the potential benefits of hair replacement therapies and hair grafting. We will order advanced thyroid testing.  We will adjust medication dosing pending results. Interim history: denies fatigue,  heat/cold intolerance, bowel/skin changes or CVS symptoms    Latest Ref Rng & Units 01/28/2022   11:22 AM  THYROID  TSH 0.35 - 5.50 uIU/mL 1.81   T4,Free(Direct) 0.60 - 1.60 ng/dL 4.01     I don't have data yet for: anti-TPO antibodies: N/A  anti-Tg antibodies: N/A  Anti-TSI antibodies: N/A   Family history of thyroid disease: Last ultrasound for thyroid nodules: N/A  Last heart exam for rhythm check:   History of gastrointestinal bleeding Was  He experiences intermittent rectal bleeding during defecation and has been diagnosed with hemorrhoids by gastroenterology. We discussed management options including a high-fiber diet and provided handouts on managing hemorrhoids. We offered a referral to a hemorrhoid surgeon if symptoms worsen. Need for influenza vaccination  Bleeding hemorrhoid  Abdominal pain, chronic, generalized  Central adiposity  Concentration deficit Concentration Issues   He reports occasional difficulty concentrating, possibly related to stress. We discussed potential attention deficit disorder (ADD) and its impact on work performance, mentioning the cost of evaluation and the benefits of treatment if diagnosed. We will consider evaluation for ADD if symptoms persist and encouraged the use of the Snore Lab app to rule out sleep-related causes of concentration issues. 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 Per patient report these have been stable long term so will document here and monitor for stability Photographs Taken 02/05/2023 :     Dyslipidemia (high LDL; low HDL)       Encounter Orders:         Ordered    Flu vaccine trivalent PF, 6mos and older(Flulaval,Afluria,Fluarix,Fluzone)        02/05/23 0943    Lipid panel       Comments: Specimen [id number]: [place]    02/05/23 1006    Comprehensive metabolic panel        02/05/23 1006    CBC with Differential/Platelet        02/05/23 1006    US Abdomen Complete        02/05/23 1006    HgB A1c        02/05/23 1006    TSH + free T4        02/05/23 1006               Today's Health Maintenance Counseling and Anticipatory Guidance:  Eye exams:  We encouraged patient to to complete eye exam every 1-2 years.  He reports his last eye exam was:  not doing  Dental health:  He reports he has not been keeping up with his dental visits.  He intends to do so in the future.  We encourage regular tooth brushing/flossing. Sinus health:  We encouraged sterile saline nasal misting sinus rinses daily for pollen, to reduce allergies and risk for sinus infections.   Sterile can based misting products are recommended due to superior misting and ease of maintaining sterility Sleep Apnea screening:  We encourage self monitoring of sleep quality with SnoreLab App and other tools/apps that are now available at retail [x]  Do you snore loudly [x]  Tired, fatigued, or sleepy during the daytime []  Witness apneas []  Significant hypertension []  BMI greater than 35    []  Age older than 39 years old []  Has large neck size over 15.7 in [x]  Male Total Score: 3 SnoreLab App   Cardiovascular Risk Factor Reduction:   Advised patient of need for regular exercise and diet rich and fruits and vegetables and healthy fats to reduce risk of heart attack and stroke.  Avoid first- and second-hand smoke and stimulants.   Avoid extreme exercise- exercise in  moderation (150 minutes per week is a good goal) Discussed health benefits of physical activity, and encouraged him to engage in regular exercise appropriate for his age and condition. Exercise Activities and Dietary recommendations  Goals   None    Wt Readings from Last 3 Encounters:  02/05/23 203 lb (92.1 kg)  08/02/22 196 lb 6.4 oz (89.1 kg)  01/28/22 199 lb 3.2 oz (90.4 kg)  Body mass index is 29.98 kg/m. Health maintenance and immunizations reviewed and he was encouraged to complete anything that is due: Immunization History  Administered Date(s) Administered  . Influenza, Seasonal, Injecte, Preservative Fre 02/05/2023  . Moderna Sars-Covid-2 Vaccination 06/03/2019, 07/06/2019   Health Maintenance Due  Topic Date Due  . DTaP/Tdap/Td (1 - Tdap) Never done    Health Maintenance  Topic Date Due  . DTaP/Tdap/Td (1 - Tdap) Never done  . COVID-19 Vaccine (3 - 2023-24 season) 02/21/2023 (Originally 11/24/2022)  . INFLUENZA VACCINE  Completed  . HPV VACCINES  Aged Out  . Hepatitis C Screening  Discontinued  . HIV Screening  Discontinued   Did flu but holding off on tetanus. Lifestyle / Social  History Reviewed:  Social History   Tobacco Use  . Smoking status: Never  . Smokeless tobacco: Never  . Tobacco comments:    never  Vaping Use  . Vaping status: Never Used  Substance Use Topics  . Alcohol use: Never  . Drug use: Never    My recommendation is total abstinence from all substances of abuse including smoke and 2nd hand smoke, alcohol, illicit drugs, smoking, inhalants, sugar, high risk sexual behavior Offered to assist with any use disorders or addictions.   Sexual transmitted infection testing offered today, but patient declined as he feels he is low risk based on his sexual history.   Social History   Substance and Sexual Activity  Sexual Activity Yes  . Birth control/protection: Condom  .      02/05/2023    9:16 AM  Depression screen PHQ 2/9  Decreased Interest 0   Down, Depressed, Hopeless 0  PHQ - 2 Score 0  Altered sleeping 0  Tired, decreased energy 2  Change in appetite 0  Feeling bad or failure about yourself  0  Trouble concentrating 2  Moving slowly or fidgety/restless 0  Suicidal thoughts 0  PHQ-9 Score 4  Difficult doing work/chores Not difficult at all    Today's Cancer Screening  Penile/Testicle/Scrotum cancer screening: Asked the patient to self-monitor for genital tumors. Patient reports there are none. Thyroid cancer screening: patient advised to check by palpating thyroid for nodules Prostate cancer screening:  Denies family history of prostate cancer or hematospermia so too young for screening by current guidelines.(No results found for: "PSA") Colon cancer screening:    Denies strong family history of colon cancer or blood in stool so no screening is indicated until age 40.   Lung cancer screening:  Current guidelines recommend Individuals aged 83 to 88 who currently smoke or formerly smoked and have a >= 20 pack-year smoking history should undergo annual screening with low-dose computed tomography (LDCT). Skin cancer screening: Advised regular sunscreen use. He denies worrisome, changing, or new skin lesions. Showed him pictures of melanomas for reference:   Return to care in 1 year for next preventative visit.      Subjective  37 y.o. male presents today for a complete physical exam.  He reports consuming a general diet. The patient does not participate in regular exercise at present. He generally feels well. He reports sleeping fairly well. He does have additional problems to discuss today: he needs me to manage his hypothyroidism prescription   HPI  AI-Extracted: Discussed the use of AI scribe software for clinical note transcription with the patient, who gave verbal consent to proceed.  History of Present Illness   The patient, with a history of thyroid disease, presented for an annual check-up. He reported no new health  issues since the last visit. The patient mentioned occasional abdominal pain, which was not severe and resolved spontaneously. He expressed concern about potential fatty liver disease due to dietary habits in his [place] country and requested screening.  The patient also reported occasional rectal bleeding, which he attributed to hemorrhoids. The bleeding was not associated with any discomfort or other symptoms. He expressed concern about the bleeding but was not interested in surgical intervention at this time.  The patient also reported occasional difficulty with concentration and forgetfulness. He was unsure if this was related to his thyroid condition or another health issue. He was considering further evaluation for attention deficit disorder but had not made a decision at the time of  the visit.  The patient also had two skin lesions, one on his back and one on his left arm. He reported that these had been present for a long time and had not changed significantly. They were not associated with any discomfort or other symptoms.  The patient was managing his thyroid condition with medication and reported no issues with this treatment. He was also taking steps to maintain a healthy lifestyle, including using olive oil in his diet and considering exercise for weight management. He was not currently interested in weight loss medication.  The patient was due for dental and eye exams and planned to schedule these. He had recently received a flu shot and was considering a tetanus shot. He was not currently using any tobacco products or illicit drugs. He was using condoms for contraception and was not planning to have more children at this time. He was not interested in a vasectomy.      Review of Systems  Constitutional:  Negative for chills, diaphoresis, fever, malaise/fatigue and weight loss.  HENT:  Negative for congestion, ear discharge, ear pain, hearing loss, nosebleeds, sinus pain, sore throat and  tinnitus.   Eyes:  Negative for blurred vision, double vision, photophobia, pain, discharge and redness.  Respiratory:  Negative for cough, hemoptysis, sputum production, shortness of breath, wheezing and stridor.   Cardiovascular:  Negative for chest pain, palpitations, orthopnea, claudication, leg swelling and PND.  Gastrointestinal:  Positive for abdominal pain and blood in stool. Negative for constipation, diarrhea, heartburn, melena, nausea and vomiting.  Genitourinary:  Negative for dysuria, flank pain, frequency, hematuria and urgency.  Musculoskeletal:  Negative for back pain, falls, joint pain, myalgias and neck pain.  Skin:  Negative for itching and rash.  Neurological:  Negative for dizziness, tingling, tremors, sensory change, speech change, focal weakness, seizures, loss of consciousness, weakness and headaches.  Endo/Heme/Allergies:  Negative for environmental allergies and polydipsia. Does not bruise/bleed easily.  Psychiatric/Behavioral:  Negative for depression, hallucinations, memory loss, substance abuse and suicidal ideas. The patient is not nervous/anxious and does not have insomnia.    Disclaimer about ROS at Annual Preventive Visits Patients are informed before the Review of Systems (ROS) that identifying significant medical issues during the wellness visit may require immediate attention, potentially resulting in a separate billable encounter beyond the scope of the preventive exam. This disclosure is mandated by professional ethics and legal obligations, as healthcare providers must address any substantial health concerns raised during any patient interaction.  A comprehensive ROS is required by insurance companies for billing the visit. However, this structure may inadvertently discourage patients from fully disclosing health concerns due to potential financial implications. Consequently, patients often emphasize that any positive ROS findings are related to stable chronic  conditions, requesting that these not be discussed during the preventive visit to avoid additional charges. Patients may also ask that reported complaints not be listed in the ROS to prevent affecting billing.  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  Dyslipidemia (High Ldl; Low Hdl)   Medications: not yet discussed Lab Results  Component Value Date   HDL 34.70 (L) 02/05/2023   CHOLHDL 6 02/05/2023   Lab Results  Component Value Date   LDLCALC 135 (H) 02/05/2023   Lab Results  Component Value Date   TRIG 108.0 02/05/2023   Lab Results  Component Value Date   CHOL  191 02/05/2023   The ASCVD Risk score (Arnett DK, et al., 2019) failed to calculate for the following reasons:   The 2019 ASCVD risk score is only valid for ages 43 to 27 Lab Results  Component Value Date   ALT 28 02/05/2023   AST 24 02/05/2023   ALKPHOS 51 02/05/2023   TSH 1.81 01/28/2022   HGBA1C 5.4 02/05/2023   Body mass index is 29.98 kg/m.  Lipoprotein(a), Apolipoprotein B (ApoB), and High-sensitivity C-reactive protein (hs-CRP) No results found for: "HSCRP", "LIPOA" Improving Your Cholesterol: Diet: Focus on a Mediterranean-style diet, limit saturated fats and sugars, and increase omega-3 fatty acids (fish, flaxseeds,nuts,extra virgin olive oil, avocados). Exercise: Engage in regular physical activity (aerobic exercises are particularly beneficial for HDL). Weight Management: Maintain a healthy weight. Smoking Cessation: Quitting smoking improves cholesterol levels.    Concentration Deficit  Hypothyroid   Started levothyroxine 2013 for elevated TSH of unknown cause.   History of Gastrointestinal Bleeding   Seen by GI in march 2023, recommended to have colonoscopy Problem went away, it was expensive and he is on high deductible plan     Overweight   Encouraged diet/exercise. Goal bmi under 25 Goal waist circumference: 32-34.   Covid (Resolved)   I attest that I have reviewed and confirmed the patients current medications to meet the medication reconciliation requirement  Current Outpatient Medications on File Prior to Visit  Medication Sig  . levothyroxine (SYNTHROID) 88 MCG tablet Take 1 tablet (88 mcg total) by mouth daily before breakfast.   No current facility-administered medications on file prior to visit.  There are no discontinued medications. Outpatient Medications Prior to Visit  Medication Sig  . levothyroxine (SYNTHROID) 88 MCG tablet Take 1 tablet (88 mcg total) by mouth daily before breakfast.   No facility-administered medications prior to visit.  The following were reviewed and/or entered/updated into our electronic MEDICAL RECORD NUMBERPast Medical History:  Diagnosis Date  . Allergy pollen allergies   Eve irritation  . Clotting disorder (HCC) 2010   Hemorrhoid  . COVID 08/02/2022  . History of gastrointestinal bleeding 01/28/2022   Seen by GI in march 2023, recommended to have colonoscopy Problem went away, it was expensive and he is on high deductible plan   . Male pattern alopecia 01/28/2022  . Thyroid disease   History reviewed. No pertinent surgical history. Social History   Socioeconomic History  . Marital status: Married    Spouse name: Not on file  . Number of children: Not on file  . Years of education: Not on file  . Highest education level: Not on file  Occupational History  . Not on file  Tobacco Use  . Smoking status: Never  . Smokeless tobacco: Never  . Tobacco comments:    never  Vaping Use  . Vaping status: Never Used  Substance and Sexual Activity  . Alcohol use: Never  . Drug use: Never  . Sexual activity: Yes    Birth control/protection: Condom  Other Topics Concern  . Not on file  Social History Narrative  . Not on file   Social Determinants of Health    Financial Resource Strain: Not on file  Food Insecurity: Not on file  Transportation Needs: Not on file  Physical Activity: Not on file  Stress: Not on file  Social Connections: Not on file  Intimate Partner Violence: Not on file   Family Status  Relation Name Status  . Mother Mom (Not Specified)  . Father dad (Not Specified)  .  Son Shari Heritage (Not Specified)  . Mat Aunt mom's sister (Not Specified)  . Mat Uncle Mom's brother (Not Specified)  . MGM Grand mom (Not Specified)  . MGF Grand Dad (Not Specified)  . PGM Grand Mom (Not Specified)  . PGF  (Not Specified)  No partnership data on file   Family History  Problem Relation Age of Onset  . Hyperlipidemia Mother   . Heart disease Mother   . Congenital heart disease Mother   . Hearing loss Father   . Kidney disease Father   . Learning disabilities Son   . Diabetes Maternal Aunt   . Stroke Maternal Uncle   . Diabetes Maternal Grandmother   . Cancer Maternal Grandfather   . Stroke Paternal Grandmother   . Kidney disease Paternal Grandfather    Allergies  Allergen Reactions  . Advil [Ibuprofen]     Affects ability to sleep.   Patient Care Team: Lula Olszewski, MD as PCP - General (Internal Medicine)      Objective  BP 100/84   Pulse 81   Temp 98 F (36.7 C) (Temporal)   Ht 5\' 9"  (1.753 m)   Wt 203 lb (92.1 kg)   SpO2 98%   BMI 29.98 kg/m    Physical Exam  Wt Readings from Last 3 Encounters:  02/05/23 203 lb (92.1 kg)  08/02/22 196 lb 6.4 oz (89.1 kg)  01/28/22 199 lb 3.2 oz (90.4 kg)  Physical Exam   HEENT: Cavities present. SKIN: Benign lipomas noted on back and left arm.     GEN: NAD, Resting Comfortably. HEENT: Tympanic membranes normal appearing bilaterally, Oropharynx clear, No Thyromegaly noted. No palpable lymphadenopathy or thyroid nodules. CARDIOVASCULAR: S1 and S2 heart sounds have regular rate and rhythm with no murmurs appreciated. PULMONARY:  Normal work of breathing. Clear to auscultation  bilaterally with no crackles, wheezes, or rhonchi. ABDOMEN: Soft, Nontender, Nondistended.  MSK: No edema, cyanosis, or clubbing noted. SKIN: Warm, dry, no lesions of concern observed. NEURO: CN2-12 grossly intact. Strength 5/5 in upper and lower extremities. Reflexes symmetric and intact bilaterally.  PSYCH: Normal affect and thought content, pleasant and cooperative. Last depression screening scores    02/05/2023    9:16 AM 08/02/2022   10:19 AM 08/02/2022   10:16 AM  PHQ 2/9 Scores  PHQ - 2 Score 0 0 0  PHQ- 9 Score 4 1    Last fall risk screening    02/05/2023    9:15 AM  Fall Risk   Falls in the past year? 0  Number falls in past yr: 0  Injury with Fall? 0  Risk for fall due to : No Fall Risks  Follow up Falls evaluation completed   Last Audit-C alcohol use screening     No data to display         A score of 3 or more in women, and 4 or more in men indicates increased risk for alcohol abuse, EXCEPT if all of the points are from question 1  Last CBC Lab Results  Component Value Date   WBC 5.3 02/05/2023   HGB 15.0 02/05/2023   HCT 43.4 02/05/2023   MCV 87.1 02/05/2023   RDW 12.7 02/05/2023   PLT 188.0 02/05/2023   Last metabolic panel Lab Results  Component Value Date   GLUCOSE 92 02/05/2023   NA 137 02/05/2023   K 3.8 02/05/2023   CL 102 02/05/2023   CO2 29 02/05/2023   BUN 15 02/05/2023   CREATININE 1.24  02/05/2023   GFR 73.35 02/05/2023   CALCIUM 9.6 02/05/2023   PROT 7.9 02/05/2023   ALBUMIN 4.5 02/05/2023   BILITOT 0.6 02/05/2023   ALKPHOS 51 02/05/2023   AST 24 02/05/2023   ALT 28 02/05/2023   Last lipids Lab Results  Component Value Date   CHOL 191 02/05/2023   HDL 34.70 (L) 02/05/2023   LDLCALC 135 (H) 02/05/2023   TRIG 108.0 02/05/2023   CHOLHDL 6 02/05/2023   Last hemoglobin A1c Lab Results  Component Value Date   HGBA1C 5.4 02/05/2023   Last thyroid functions Lab Results  Component Value Date   TSH 1.81 01/28/2022   Last  vitamin D No results found for: "25OHVITD2", "25OHVITD3", "VD25OH" Last vitamin B12 and Folate No results found for: "[id number]", "FOLATE"    Lula Olszewski, MD  [facility] [place] [phone] (phone) [phone] (fax) [facility]

## 2023-02-05 NOTE — Assessment & Plan Note (Addendum)
Per patient report these have been stable long term so will document here and monitor for stability Photographs Taken 02/05/2023 :

## 2023-02-05 NOTE — Assessment & Plan Note (Signed)
  Fatty Liver Screening   He expressed concern about potential fatty liver disease due to dietary habits and occasional mild abdominal pain. We discussed the importance of staying lean and the role of exercise and diet in preventing fatty liver disease. We will order an abdominal ultrasound for screening of fatty liver and evaluation of abdominal pain and check liver function tests in blood work.

## 2023-02-05 NOTE — Assessment & Plan Note (Signed)
Concentration Issues   He reports occasional difficulty concentrating, possibly related to stress. We discussed potential attention deficit disorder (ADD) and its impact on work performance, mentioning the cost of evaluation and the benefits of treatment if diagnosed. We will consider evaluation for ADD if symptoms persist and encouraged the use of the Snore Lab app to rule out sleep-related causes of concentration issues.

## 2023-02-05 NOTE — Assessment & Plan Note (Addendum)
Thyroid Hormone Management   We are managing his ongoing thyroid hormone levels, with symptoms including slight weight gain and hair loss but no palpitations. We discussed the potential benefits of hair replacement therapies and hair grafting. We will order advanced thyroid testing.  We will adjust medication dosing pending results. Interim history: denies fatigue,  heat/cold intolerance, bowel/skin changes or CVS symptoms    Latest Ref Rng & Units 01/28/2022   11:22 AM  THYROID  TSH 0.35 - 5.50 uIU/mL 1.81   T4,Free(Direct) 0.60 - 1.60 ng/dL 5.42     I don't have data yet for: anti-TPO antibodies: N/A  anti-Tg antibodies: N/A  Anti-TSI antibodies: N/A   Family history of thyroid disease: Last ultrasound for thyroid nodules: N/A  Last heart exam for rhythm check:

## 2023-02-06 ENCOUNTER — Encounter: Payer: Self-pay | Admitting: Internal Medicine

## 2023-02-06 LAB — TSH+FREE T4: TSH W/REFLEX TO FT4: 3.27 m[IU]/L (ref 0.40–4.50)

## 2023-02-06 NOTE — Progress Notes (Signed)
Reviewed annual labs and exam findings 02/05/2023. Notable for new dyslipidemia (LDL 135, HDL 34.7). TSH stable at 3.27. Liver enzymes normal. BMI 29.98. Abdominal ultrasound ordered for chronic pain and fatty liver screening. Lifestyle modifications discussed for cholesterol management. Documented stable lipomas. Hemorrhoids stable. Follow-up scheduled 02/11/2024. See Patient Message for detailed recommendations and treatment plan.

## 2023-02-10 NOTE — Telephone Encounter (Signed)
Informed patient of lab results/notes. Scheduled to come in on 02/11/24 for annual physical. Abdominal U/S is scheduled for 02/12/23.

## 2023-02-12 ENCOUNTER — Ambulatory Visit
Admission: RE | Admit: 2023-02-12 | Discharge: 2023-02-12 | Disposition: A | Discharge status: Home / Self Care | Payer: 59 | Source: Ambulatory Visit | Attending: Internal Medicine | Admitting: Internal Medicine

## 2023-02-12 DIAGNOSIS — E039 Hypothyroidism, unspecified: Secondary | ICD-10-CM

## 2023-02-12 DIAGNOSIS — R41840 Attention and concentration deficit: Secondary | ICD-10-CM

## 2023-02-12 DIAGNOSIS — Z0001 Encounter for general adult medical examination with abnormal findings: Secondary | ICD-10-CM

## 2023-02-12 DIAGNOSIS — L649 Androgenic alopecia, unspecified: Secondary | ICD-10-CM

## 2023-02-12 DIAGNOSIS — E65 Localized adiposity: Secondary | ICD-10-CM

## 2023-02-12 DIAGNOSIS — E663 Overweight: Secondary | ICD-10-CM

## 2023-02-12 DIAGNOSIS — K649 Unspecified hemorrhoids: Secondary | ICD-10-CM

## 2023-02-12 DIAGNOSIS — Z8719 Personal history of other diseases of the digestive system: Secondary | ICD-10-CM

## 2023-02-12 DIAGNOSIS — G8929 Other chronic pain: Secondary | ICD-10-CM

## 2023-02-12 DIAGNOSIS — Z0000 Encounter for general adult medical examination without abnormal findings: Secondary | ICD-10-CM

## 2023-02-13 ENCOUNTER — Encounter: Payer: Self-pay | Admitting: Internal Medicine

## 2023-02-13 DIAGNOSIS — K760 Fatty (change of) liver, not elsewhere classified: Secondary | ICD-10-CM | POA: Insufficient documentation

## 2023-02-13 NOTE — Progress Notes (Signed)
Reviewed abdominal ultrasound from 02/12/2023. Increased hepatic echogenicity consistent with fatty liver. Normal gallbladder, bile ducts, pancreas, kidneys, and spleen. No concerning findings. LFTs normal. Lifestyle modifications recommended. See Patient Message for detailed plan.

## 2023-02-14 NOTE — Telephone Encounter (Signed)
Informed patient of lab results/notes.

## 2023-02-14 NOTE — Telephone Encounter (Signed)
Left vm for patient to call the office back or check my chart for lab results/notes.

## 2023-02-17 NOTE — Progress Notes (Signed)
Patient's review of imaging results/notes confirmed.

## 2023-03-13 ENCOUNTER — Other Ambulatory Visit: Payer: Self-pay | Admitting: Internal Medicine

## 2023-03-13 MED ORDER — LEVOTHYROXINE SODIUM 88 MCG PO TABS: 88.0000 ug | ORAL_TABLET | Freq: Every day | ORAL | 3 refills | Status: DC

## 2023-08-01 ENCOUNTER — Ambulatory Visit (INDEPENDENT_AMBULATORY_CARE_PROVIDER_SITE_OTHER): Admitting: Family

## 2023-08-01 ENCOUNTER — Encounter: Payer: Self-pay | Admitting: Family

## 2023-08-01 VITALS — BP 119/80 | HR 79 | Temp 97.3°F | Ht 69.0 in | Wt 206.8 lb

## 2023-08-01 DIAGNOSIS — M778 Other enthesopathies, not elsewhere classified: Secondary | ICD-10-CM | POA: Diagnosis not present

## 2023-08-01 NOTE — Progress Notes (Signed)
 Patient ID: Robert Macias, male    DOB: April 03, 1985, 38 y.o.   MRN: [id number]  Chief Complaint  Patient presents with   Hand Pain    Pt c/o left wrist pain, present for 3 weeks. Has tried a wrist brace and pressure which did not help sx.   Discussed the use of AI scribe software for clinical note transcription with the patient, who gave verbal consent to proceed.  History of Present Illness Robert Macias is a 38 year old male who presents with left wrist pain.  He experiences localized pain in the left wrist for the past few weeks, worsening with movement, especially when pushing on the wrist or holding objects, causing difficulty in gripping. There is no history of injury or trauma. No tingling, numbness, or electrical sensations are present. Pain is mild at rest but increases with activity. He uses a wrist brace for support with minimal relief. 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 He is left-handed, affecting daily activities and work due to pain and reduced grip strength. He has not taken pain medication as the pain is not severe but is concerned about the lack of grip strength.  Assessment & Plan Left wrist tendinitis  Left wrist tendinitis with swelling and pain possibly due to overuse from computer use, he is left handed. Conservative management recommended. Recovery expected in 4-6 weeks. - Advise ergonomic mouse and wrist pads. - Recommend wrist brace with compression. - Instruct to apply ice for 20 minutes several times daily for a week. - Provided handout for wrist strengthening exercises post-symptom improvement. - Suggest 600mg  ibuprofen or 440mg  naproxen bid for swelling or pain if needed. - Advise follow-up in 1-2 months if no improvement for potential orthopedic referral.       Subjective:     Outpatient Medications Prior to Visit  Medication Sig Dispense Refill   levothyroxine  (SYNTHROID ) 88  MCG tablet Take 1 tablet (88 mcg total) by mouth daily before breakfast. 90 tablet 3   No facility-administered medications prior to visit.   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 Allergies  Allergen Reactions   Advil [Ibuprofen]     Affects ability to sleep.      Objective:    Physical Exam Vitals and nursing note reviewed.  Constitutional:      General: He is not in acute distress.    Appearance: Normal appearance.  HENT:     Head: Normocephalic.  Cardiovascular:     Rate and Rhythm: Normal rate and regular rhythm.  Pulmonary:     Effort: Pulmonary effort is normal.     Breath sounds: Normal breath sounds.  Musculoskeletal:     Left wrist: Swelling (lateral, mild, w/decreased strength in hand), tenderness and bony tenderness present. Normal range of motion.     Cervical back: Normal range of motion.  Skin:    General: Skin is warm and dry.  Neurological:     Mental Status: He is alert and oriented to person, place, and time.  Psychiatric:        Mood and Affect: Mood normal.    BP 119/80 (BP Location: Left Arm, Patient Position: Sitting, Cuff Size: Large)   Pulse 79   Temp (!) 97.3 F (36.3 C) (Temporal)   Ht 5\' 9"  (1.753 m)   Wt 206 lb 12.8 oz (93.8 kg)   SpO2 100%   BMI 30.54 kg/m  Wt Readings from Last 3 Encounters:  08/01/23 206 lb 12.8 oz (93.8 kg)  02/05/23 203 lb (92.1 kg)  08/02/22 196 lb 6.4 oz (89.1 kg)      Versa Gore, NP

## 2023-08-01 NOTE — Patient Instructions (Signed)
 It was very nice to see you today!   I will review your lab results via MyChart in a few days.   Have a great rest of the week!   PLEASE NOTE:  If you had any lab tests please let us know if you have not heard back within a few days. You may see your results on MyChart before we have a chance to review them but we will give you a call once they are reviewed by [place]. If we ordered any referrals today, please let us know if you have not heard from their office within the next week.

## 2024-02-11 ENCOUNTER — Encounter: Payer: Self-pay | Admitting: Internal Medicine

## 2024-02-11 ENCOUNTER — Ambulatory Visit: Payer: 59 | Admitting: Internal Medicine

## 2024-02-11 VITALS — BP 112/70 | HR 85 | Temp 98.0°F | Ht 69.0 in | Wt 208.6 lb

## 2024-02-11 DIAGNOSIS — R0683 Snoring: Secondary | ICD-10-CM

## 2024-02-11 DIAGNOSIS — E663 Overweight: Secondary | ICD-10-CM

## 2024-02-11 DIAGNOSIS — Z136 Encounter for screening for cardiovascular disorders: Secondary | ICD-10-CM

## 2024-02-11 DIAGNOSIS — K5909 Other constipation: Secondary | ICD-10-CM | POA: Diagnosis not present

## 2024-02-11 DIAGNOSIS — Z23 Encounter for immunization: Secondary | ICD-10-CM

## 2024-02-11 DIAGNOSIS — E039 Hypothyroidism, unspecified: Secondary | ICD-10-CM

## 2024-02-11 DIAGNOSIS — Z833 Family history of diabetes mellitus: Secondary | ICD-10-CM

## 2024-02-11 DIAGNOSIS — E559 Vitamin D deficiency, unspecified: Secondary | ICD-10-CM | POA: Diagnosis not present

## 2024-02-11 DIAGNOSIS — Z0001 Encounter for general adult medical examination with abnormal findings: Secondary | ICD-10-CM | POA: Diagnosis not present

## 2024-02-11 LAB — LIPID PANEL
Cholesterol: 184 mg/dL (ref 0–200)
HDL: 33.7 mg/dL — ABNORMAL LOW (ref >39.00)
LDL Cholesterol: 122 mg/dL — ABNORMAL HIGH (ref 0–99)
NonHDL: 149.81
Total CHOL/HDL Ratio: 5
Triglycerides: 139 mg/dL (ref 0.0–149.0)
VLDL: 27.8 mg/dL (ref 0.0–40.0)

## 2024-02-11 LAB — CBC WITH DIFFERENTIAL/PLATELET
Basophils Absolute: 0 K/uL (ref 0.0–0.1)
Basophils Relative: 0.7 % (ref 0.0–3.0)
Eosinophils Absolute: 0.3 K/uL (ref 0.0–0.7)
Eosinophils Relative: 5.6 % — ABNORMAL HIGH (ref 0.0–5.0)
HCT: 42.3 % (ref 39.0–52.0)
Hemoglobin: 14.8 g/dL (ref 13.0–17.0)
Lymphocytes Relative: 36.9 % (ref 12.0–46.0)
Lymphs Abs: 2 K/uL (ref 0.7–4.0)
MCHC: 34.9 g/dL (ref 30.0–36.0)
MCV: 86.1 fl (ref 78.0–100.0)
Monocytes Absolute: 0.4 K/uL (ref 0.1–1.0)
Monocytes Relative: 8 % (ref 3.0–12.0)
Neutro Abs: 2.7 K/uL (ref 1.4–7.7)
Neutrophils Relative %: 48.8 % (ref 43.0–77.0)
Platelets: 185 K/uL (ref 150.0–400.0)
RBC: 4.91 Mil/uL (ref 4.22–5.81)
RDW: 13 % (ref 11.5–15.5)
WBC: 5.4 K/uL (ref 4.0–10.5)

## 2024-02-11 LAB — COMPREHENSIVE METABOLIC PANEL WITH GFR
ALT: 27 U/L (ref 0–53)
AST: 23 U/L (ref 0–37)
Albumin: 4.5 g/dL (ref 3.5–5.2)
Alkaline Phosphatase: 48 U/L (ref 39–117)
BUN: 15 mg/dL (ref 6–23)
CO2: 28 meq/L (ref 19–32)
Calcium: 9.4 mg/dL (ref 8.4–10.5)
Chloride: 102 meq/L (ref 96–112)
Creatinine, Ser: 1.11 mg/dL (ref 0.40–1.50)
GFR: 83.17 mL/min (ref >60.00)
Glucose, Bld: 94 mg/dL (ref 70–99)
Potassium: 4 meq/L (ref 3.5–5.1)
Sodium: 138 meq/L (ref 135–145)
Total Bilirubin: 0.6 mg/dL (ref 0.2–1.2)
Total Protein: 7.9 g/dL (ref 6.0–8.3)

## 2024-02-11 NOTE — Assessment & Plan Note (Signed)
 History of Hemorrhoids with Intermittent Rectal Bleeding   Intermittent rectal bleeding attributed to hemorrhoids, exacerbated by pork consumption. Previous colonoscopy in 2023 showed hemorrhoids. Advised avoidance of pork to prevent bleeding. Offered colonoscopy if significant bleeding or anemia occurs.

## 2024-02-11 NOTE — Assessment & Plan Note (Signed)
 Thyroid  disorder with potential contribution to sleepiness and constipation. Ordered advanced thyroid  test to assess current thyroid  function.

## 2024-02-11 NOTE — Progress Notes (Signed)
 [facility] [address] [place], [place] [address] Office:  [phone]  -- Annual Preventive Medical Office Visit --  Patient:  Robert Macias      Age: 38 y.o.       Sex:  male  Date:   02/11/2024 Patient Care Team: Jesus Bernardino MATSU, MD as PCP - General (Internal Medicine) Today's Healthcare Provider: Bernardino MATSU Jesus, MD  ========================================= Chief complaint: Annual Exam (Pt is present for cpe pt is fasting for labs ) and Insomnia (Pt is feeling tired during day due to not sleeping well at night time)  Purpose of Visit: Comprehensive preventive health assessment and personalized health maintenance planning.  This encounter was conducted as a Comprehensive Physical Exam (CPE) preventive care annual visit. The patient's medical history and problem list were reviewed to inform individualized preventive care recommendations.   No problem-specific medical treatment was provided during this visit.  Assessment & Plan Encounter for annual general medical examination with abnormal findings in adult FH: diabetes mellitus Screening for cardiovascular condition Routine adult wellness visit focused on general health maintenance, including cancer screenings and immunizations. Ordered A1c, metabolic panel, blood count, and advanced thyroid  test. Administered tetanus vaccine. Performed skin examination and documented any suspicious lesions.  married, no past surgical history. Major comorbidities include hypothyroidism (on levothyroxine ), history of gastrointestinal bleeding (resolved, colonoscopy recommended but deferred), clotting disorder (2010), pollen allergies, male pattern alopecia, and prior COVID-19 infection (May 2024). Family history notable for cardiovascular disease, diabetes, kidney disease, cancer. Need for DTaP vaccine Immunization due Immunization Counseling and Administration   Immunization status reviewed. Tetanus vaccine due  as last administered in 2022. Discussed importance of tetanus vaccine for protection against tetanus, especially with potential penetrating injuries. Administered tetanus vaccine. Snoring Snoring and Daytime Sleepiness   Snoring and daytime sleepiness noted, with previous sleep study indicating snoring without sleep apnea. Symptoms improve with side sleeping. Potential vitamin deficiency considered if thyroid  function is normal. Forwarded sleep study report. Recommended side sleeping with a pillow to maintain position. Ordered advanced thyroid  test. Recommended vitamin D and B12 supplementation. Chronic constipation History of Hemorrhoids with Intermittent Rectal Bleeding   Intermittent rectal bleeding attributed to hemorrhoids, exacerbated by pork consumption. Previous colonoscopy in 2023 showed hemorrhoids. Advised avoidance of pork to prevent bleeding. Offered colonoscopy if significant bleeding or anemia occurs. Acquired hypothyroidism Thyroid  disorder with potential contribution to sleepiness and constipation. Ordered advanced thyroid  test to assess current thyroid  function. Overweight Abdominal obesity with visceral fat contributing to metabolic risk. Current weight is 201 lbs, same as last year. Discussed dietary modifications and exercise to reduce visceral fat. Recommended low-carb diet with cauliflower rice as a substitute. Encouraged bodybuilding exercises to increase muscle mass and burn fat. Vitamin D deficiency Suspected Vitamin D and B12 Deficiency   Suspected vitamin D and B12 deficiency contributing to fatigue and sleepiness. Discussed cost-effectiveness of supplementation over testing. Recommended daily vitamin D and B12 supplementation.     ICD-10-CM   1. Encounter for annual general medical examination with abnormal findings in adult  Z00.01 Lipid panel    Comprehensive metabolic panel with GFR    CBC with Differential/Platelet    Hemoglobin A1c    TSH + free T4    2. FH:  diabetes mellitus  Z83.3     3. Screening for cardiovascular condition  Z13.6     4. Need for DTaP vaccine  Z23 Tdap vaccine greater than or equal to 7yo IM    5. Immunization due  Z23 Flu vaccine trivalent PF, 6mos and older(Flulaval,Afluria,Fluarix,Fluzone)    6. Snoring  R06.83     7. Chronic constipation  K59.09     8. Acquired hypothyroidism  E03.9     9. Overweight  E66.3     10. Vitamin D deficiency  E55.9        Reviewed/updated/encouraged completion: Immunization History  Administered Date(s) Administered   Hepatitis B, ADULT 08/25/2020   Hepb-cpg 08/22/2020   Influenza, Seasonal, Injecte, Preservative Fre 02/05/2023, 02/11/2024   MMR 03/08/2016   Moderna Sars-Covid-2 Vaccination 06/03/2019, 07/06/2019, 03/29/2020   Td 03/08/2016   Tdap 02/11/2024   Health Maintenance Due  Topic Date Due   HPV VACCINES (1 - 3-dose SCDM series) Never done   Hepatitis B Vaccines 19-59 Average Risk (2 of 2 - CpG 2-dose series) 09/22/2020   Health Maintenance  Topic Date Due   HPV VACCINES (1 - 3-dose SCDM series) Never done   Hepatitis B Vaccines 19-59 Average Risk (2 of 2 - CpG 2-dose series) 09/22/2020   COVID-19 Vaccine (4 - 2025-26 season) 02/27/2024 (Originally 11/24/2023)   DTaP/Tdap/Td (3 - Td or Tdap) 02/10/2034   Influenza Vaccine  Completed   Pneumococcal Vaccine  Aged Out   Meningococcal B Vaccine  Aged Out   Hepatitis C Screening  Discontinued   HIV Screening  Discontinued    Reviewed the following verbally with patient and provided AVS materials:   HEALTH MAINTENANCE COUNSELING AND ANTICIPATORY GUIDANCE    Preventive Measure Recommendation  Eye Exams Every 1-2 years  Dental Care Cleanings every 6 months or more, brush/floss 3x daily  Sinus Care Saline spray rinses daily  Sleep 8 hours nightly, good sleep hygiene, e-monitoring if any daytime drowsiness  Diet Fruits/vegetables/fiber/healthy fats, balance and moderation  Exercise 150 minutes weekly  Risk  Behaviors Discouraged any/all high risk behaviors    CANCER SCREENING SHARED DECISION MAKING    Penile/Testicle/Scrotum Encouraged self-monitoring and reporting of genital abnormalities. Patient reports none.  Thyroid  Thyroid  was palpated for nodules today.   Prostate Individualized risks/benefits/costs discussed  Patient is <45-50 and average risk; routine screening not indicated today. Education provided about future SDM timeline.No results found for: PSA  Colon HM Colonoscopy   This patient has no relevant Health Maintenance data.    Patient is <45 without additional risk factors (reports no bleeding or early family history or inflammatory bowel disease); no routine screening indicated today. Counseling provided on symptoms that warrant earlier evaluation. (Education given.)    Lung Current guidelines recommend individuals aged 29 to 51 who currently smoke or formerly smoked and have a >= 20 pack-year smoking history should undergo annual screening with low-dose computed tomography (LDCT). Tobacco Use: Low Risk  (02/11/2024)   Patient History    Smoking Tobacco Use: Never    Smokeless Tobacco Use: Never    Passive Exposure: Not on file   Social History   Tobacco Use  Smoking Status Never  Smokeless Tobacco Never  Tobacco Comments   never    Skin Advised regular sunscreen use. Patient denies worrisome, changing, or new skin lesions. Offered to include images in chart for surveillance. Showed patient these pictures of melanomas for reference to educate for self-monitoring.  Other Cancers Discussed lack of screening guidelines and insurance coverage for other cancer types.    Discussed the use of AI scribe software for clinical note transcription with the patient, who gave verbal consent to proceed.  History of Present Illness 38 year old male who presents with daytime  sleepiness and snoring.  He experiences daytime sleepiness and fatigue, which he attributes to possible sleep  disturbances. He wakes up during the night and has consulted a sleep therapist. He has a history of thyroid  issues. He snores heavily, as noted by his wife, and experiences teeth grinding at night. A previous sleep study indicated no sleep apnea, but snoring occurs when he lies flat. He has not yet found a satisfactory solution to his sleep issues.  He reports occasional gastrointestinal bleeding, which he attributes to hemorrhoids, confirmed by a colonoscopy in 2023. Consuming pork exacerbates the bleeding, and he avoids it to prevent symptoms.  He mentions having several skin lumps. He has a history of receiving vaccines in 2022 but is unsure of the specific ones and is open to receiving a tetanus booster.  He maintains a stable weight of 201 pounds. He acknowledges the challenge of reducing carbohydrate intake due to cultural dietary habits but is considering alternatives like cauliflower rice. He is active and has a history of being physically active in his youth. 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  ROS A comprehensive ROS was negative for any concerning symptoms. (Gastrointestinal bleeding is rare and known to be from hemorrhoids)  Completed medication reconciliation: Current Outpatient Medications on File Prior to Visit  Medication Sig   levothyroxine  (SYNTHROID ) 88 MCG tablet Take 1 tablet (88 mcg total) by mouth daily before breakfast.   No current facility-administered medications on file prior to visit.  There are no discontinued medications.The following were reviewed and/or entered/updated into our electronic MEDICAL RECORD NUMBERPast Medical History:  Diagnosis Date   Allergy pollen allergies   Eve irritation   Clotting disorder 2010   Hemorrhoid   COVID 08/02/2022   History of gastrointestinal bleeding 01/28/2022   Seen by GI in march 2023, recommended to have colonoscopy Problem went away, it was expensive and he is on high  deductible plan    Male pattern alopecia 01/28/2022   Thyroid  disease   History reviewed. No pertinent surgical history. Social History   Socioeconomic History   Marital status: Married    Spouse name: Not on file   Number of children: Not on file   Years of education: Not on file   Highest education level: Not on file  Occupational History   Not on file  Tobacco Use   Smoking status: Never   Smokeless tobacco: Never   Tobacco comments:    never  Vaping Use   Vaping status: Never Used  Substance and Sexual Activity   Alcohol use: Never   Drug use: Never   Sexual activity: Yes    Birth control/protection: Condom  Other Topics Concern   Not on file  Social History Narrative   Not on file   Social Drivers of Health   Financial Resource Strain: Not on file  Food Insecurity: Not on file  Transportation Needs: Not on file  Physical Activity: Not on file  Stress: Not on file  Social Connections: Not on file  Intimate Partner Violence: Not on file    Family History  Problem Relation Age of Onset   Hyperlipidemia Mother    Heart disease Mother    Congenital heart disease Mother    Hearing loss Father    Kidney disease Father    Learning disabilities Son    Diabetes Maternal Aunt    Stroke Maternal Uncle    Diabetes Maternal Grandmother    Cancer Maternal Grandfather  Stroke Paternal Grandmother    Kidney disease Paternal Grandfather    Allergies  Allergen Reactions   Advil [Ibuprofen]     Affects ability to sleep.   Social History   Substance and Sexual Activity  Sexual Activity Yes   Birth control/protection: Condom  @    02/11/2024    9:11 AM  Depression screen PHQ 2/9  Decreased Interest 0  Down, Depressed, Hopeless 0  PHQ - 2 Score 0      02/11/2024    9:11 AM  Fall Risk   Falls in the past year? 0  Number falls in past yr: 0  Injury with Fall? 0  Risk for fall due to : No Fall Risks  Follow up Falls evaluation completed     BP  112/70   Pulse 85   Temp 98 F (36.7 C) (Temporal)   Ht 5' 9 (1.753 m)   Wt 208 lb 9.6 oz (94.6 kg)   SpO2 98%   BMI 30.80 kg/m  BP Readings from Last 3 Encounters:  02/11/24 112/70  08/01/23 119/80  02/05/23 100/84   Wt Readings from Last 10 Encounters:  02/11/24 208 lb 9.6 oz (94.6 kg)  08/01/23 206 lb 12.8 oz (93.8 kg)  02/05/23 203 lb (92.1 kg)  08/02/22 196 lb 6.4 oz (89.1 kg)  01/28/22 199 lb 3.2 oz (90.4 kg)  Physical Exam  Physical Exam MEASUREMENTS: Weight- 201. NECK: Supple with strong musculature. CHEST: Clear to auscultation bilaterally. ABDOMEN: Non-tender, without masses.  GEN: No acute distress, resting comfortably. HEENT: Tympanic membranes normal appearing bilaterally, oropharynx clear, no thyromegaly noted, no palpable lymphadenopathy or thyroid  nodules. CARDIOVASCULAR: S1 and S2 heart sounds with regular rate and rhythm, no murmurs appreciated. PULMONARY: Normal work of breathing, clear to auscultation bilaterally, no crackles, wheezes, or rhonchi. ABDOMEN: Soft, nontender, nondistended. MSK: No edema, cyanosis, or clubbing noted. SKIN: Warm, dry, no lesions of concern observed. NEUROLOGICAL: Cranial nerves II-XII grossly intact, strength 5/5 in upper and lower extremities, reflexes symmetric and intact bilaterally. PSYCH: Normal affect and thought content, pleasant and cooperative.  Last CBC Lab Results  Component Value Date   WBC 5.3 02/05/2023   HGB 15.0 02/05/2023   HCT 43.4 02/05/2023   MCV 87.1 02/05/2023   RDW 12.7 02/05/2023   PLT 188.0 02/05/2023   Last metabolic panel Lab Results  Component Value Date   GLUCOSE 92 02/05/2023   NA 137 02/05/2023   K 3.8 02/05/2023   CL 102 02/05/2023   CO2 29 02/05/2023   BUN 15 02/05/2023   CREATININE 1.24 02/05/2023   GFR 73.35 02/05/2023   CALCIUM 9.6 02/05/2023   PROT 7.9 02/05/2023   ALBUMIN 4.5 02/05/2023   BILITOT 0.6 02/05/2023   ALKPHOS 51 02/05/2023   AST 24 02/05/2023   ALT 28  02/05/2023   Last lipids Lab Results  Component Value Date   CHOL 191 02/05/2023   HDL 34.70 (L) 02/05/2023   LDLCALC 135 (H) 02/05/2023   TRIG 108.0 02/05/2023   CHOLHDL 6 02/05/2023   Last hemoglobin A1c Lab Results  Component Value Date   HGBA1C 5.4 02/05/2023   Last thyroid  functions Lab Results  Component Value Date   TSH 1.81 01/28/2022   FREET4 1.02 01/28/2022   Last vitamin D No results found for: 25OHVITD2, 25OHVITD3, VD25OH   ======================================  IMPORTANT HEALTH REMINDERS: Report any new or changing skin lesions promptly Maintain recommended screening schedules Discuss any new family history of cancer at future visits Follow up on  any new symptoms that persist more than two weeks      Notes:  This document was synthesized by artificial intelligence ([employer]) using HIPAA-compliant recording of the clinical interaction;   We discussed the use of AI scribe software for clinical note transcription with the patient, who gave verbal consent to proceed.    This encounter employed state-of-the-art, real-time, collaborative documentation. The patient was empowered to actively review and assist in updating their electronic medical record on a shared monitor, ensuring transparency and improving accuracy.    Prior to and at the beginning of Comprehensive Physical Exam (CPE) preventive care annual visit appointment types  we clarify to patients Our goal today is to focus on your preventive or annual Comprehensive Physical Exam (CPE) preventive care annual visit, which typically covers routine screenings and overall health maintenance. However, if you share any new or concerning symptoms--such as dizziness, passing out, severe pain, or anything else that may point to a more serious issue--we are both legally and ethically required to evaluate it. We cannot simply overlook or ignore such concerns, even if you later decide you don't want to discuss them,  because it could jeopardize your health.  If addressing a new concern takes us  beyond the scope of the preventive visit, we may need to bill separately for that portion of care. We understand financial considerations are important, and we're happy to discuss your options if something new comes up. However, we want to be clear that once you mention a potentially serious issue, we must investigate it; we can't ethically or legally exclude that from our records or our evaluation. Please let us  know all of your questions or worries. Together, we can decide how best to manage them and how to minimize any unexpected costs, but we want to keep you safe above all else.   This disclosure is mandated by professional ethics and legal obligations, as healthcare providers must address any substantial health concerns raised during any patient interaction and a comprehensive ROS is required by insurance companies for billing preventive-care visit type.   This disclosure ultimately discourages patients financially from reporting significant health issues.   Medical Screening Exam A medical screening exam (MSE) helps to determine whether you need immediate medical treatment relating to any number of symptoms you are having. This type of exam may be done in an emergency department, an urgent care setting, or your health care provider's office. Depending on your symptoms and severity, you may need additional tests or medical therapy. It is important to note that an MSE does not necessarily mean that you will need or receive further medical testing or interventions if your symptoms are not deemed to be medically urgent (emergent). Tell a health care provider about: Any allergies you have. All medicines you are taking, including vitamins, herbs, eye drops, creams, and over-the-counter medicines. Any problems you or family members have had with anesthetic medicines. Any bleeding problems you have. Any surgeries you have  had. Any medical conditions you have. Whether you are pregnant or may be pregnant. What happens during the test? During the exam, a health care provider does a short, often focused, physical exam and asks about your medical history to assess: Your current symptoms. Your overall health. Your need for possible further medical intervention. What can I expect after the test? If you have a regular health care provider, make an appointment for a follow-up visit with him or her. If you do not have a regular health care provider, ask about  resources in your community. Your medical screening exam may determine that: You do not need emergency treatment at this time. You need treatment right away. You need to be transferred to another medical center. This may happen if you need an emergent specialist or consultant that is not available at the medical center you are at. You need to have more tests. A medical specialist may be consulted if needed. Get help right away if: Your condition gets worse. You develop new or troubling symptoms before you see your health care provider. These symptoms may represent a serious problem that is an emergency. Do not wait to see if the symptoms will go away. Get medical help right away. Call your local emergency services (911 in the [place] Do not drive yourself to the hospital. Summary A medical screening exam helps to determine whether you need medical treatment right away. This type of exam may be done in an emergency department, an urgent care setting, or your health care provider's office. During the exam, a health care provider does a short physical exam and asks about your current symptoms and overall health. Depending on the exam, more tests or therapies may be ordered. However, an MSE does not necessarily mean that you will have further medical testing if your symptoms are not deemed to be urgent. If you need further care that is not offered at your current  medical center, you may need to be transferred to another facility. This information is not intended to replace advice given to you by your health care provider. Make sure you discuss any questions you have with your health care provider. Document Revised: 11/22/2020 Document Reviewed: 07/20/2020 Elsevier Patient Education  2024 [employer]   Health Maintenance, Male Adopting a healthy lifestyle and getting preventive care are important in promoting health and wellness. Ask your health care provider about: The right schedule for you to have regular tests and exams. Things you can do on your own to prevent diseases and keep yourself healthy. What should I know about diet, weight, and exercise? Eat a healthy diet  Eat a diet that includes plenty of vegetables, fruits, low-fat dairy products, and lean protein. Do not eat a lot of foods that are high in solid fats, added sugars, or sodium. Maintain a healthy weight Body mass index (BMI) is a measurement that can be used to identify possible weight problems. It estimates body fat based on height and weight. Your health care provider can help determine your BMI and help you achieve or maintain a healthy weight. Get regular exercise Get regular exercise. This is one of the most important things you can do for your health. Most adults should: Exercise for at least 150 minutes each week. The exercise should increase your heart rate and make you sweat (moderate-intensity exercise). Do strengthening exercises at least twice a week. This is in addition to the moderate-intensity exercise. Spend less time sitting. Even light physical activity can be beneficial. Watch cholesterol and blood lipids Have your blood tested for lipids and cholesterol at 40 years of age, then have this test every 5 years. You may need to have your cholesterol levels checked more often if: Your lipid or cholesterol levels are high. You are older than 40 years of age. You are  at high risk for heart disease. What should I know about cancer screening? Many types of cancers can be detected early and may often be prevented. Depending on your health history and family history, you may need to  have cancer screening at various ages. This may include screening for: Colorectal cancer. Prostate cancer. Skin cancer. Lung cancer. What should I know about heart disease, diabetes, and high blood pressure? Blood pressure and heart disease High blood pressure causes heart disease and increases the risk of stroke. This is more likely to develop in people who have high blood pressure readings or are overweight. Talk with your health care provider about your target blood pressure readings. Have your blood pressure checked: Every 3-5 years if you are 98-33 years of age. Every year if you are 26 years old or older. If you are between the ages of 45 and 65 and are a current or former smoker, ask your health care provider if you should have a one-time screening for abdominal aortic aneurysm (AAA). Diabetes Have regular diabetes screenings. This checks your fasting blood sugar level. Have the screening done: Once every three years after age 93 if you are at a normal weight and have a low risk for diabetes. More often and at a younger age if you are overweight or have a high risk for diabetes. What should I know about preventing infection? Hepatitis B If you have a higher risk for hepatitis B, you should be screened for this virus. Talk with your health care provider to find out if you are at risk for hepatitis B infection. Hepatitis C Blood testing is recommended for: Everyone born from [birth year] through 1967. Anyone with known risk factors for hepatitis C. Sexually transmitted infections (STIs) You should be screened each year for STIs, including gonorrhea and chlamydia, if: You are sexually active and are younger than 40 years of age. You are older than 40 years of age and your health  care provider tells you that you are at risk for this type of infection. Your sexual activity has changed since you were last screened, and you are at increased risk for chlamydia or gonorrhea. Ask your health care provider if you are at risk. Ask your health care provider about whether you are at high risk for HIV. Your health care provider may recommend a prescription medicine to help prevent HIV infection. If you choose to take medicine to prevent HIV, you should first get tested for HIV. You should then be tested every 3 months for as long as you are taking the medicine. Follow these instructions at home: Alcohol use Do not drink alcohol if your health care provider tells you not to drink. If you drink alcohol: Limit how much you have to 0-2 drinks a day. Know how much alcohol is in your drink. In the [place] one drink equals one 12 oz bottle of beer (355 mL), one 5 oz glass of wine (148 mL), or one 1 oz glass of hard liquor (44 mL). Lifestyle Do not use any products that contain nicotine or tobacco. These products include cigarettes, chewing tobacco, and vaping devices, such as e-cigarettes. If you need help quitting, ask your health care provider. Do not use street drugs. Do not share needles. Ask your health care provider for help if you need support or information about quitting drugs. General instructions Schedule regular health, dental, and eye exams. Stay current with your vaccines. Tell your health care provider if: You often feel depressed. You have ever been abused or do not feel safe at home. Summary Adopting a healthy lifestyle and getting preventive care are important in promoting health and wellness. Follow your health care provider's instructions about healthy diet, exercising, and getting tested or  screened for diseases. Follow your health care provider's instructions on monitoring your cholesterol and blood pressure. This information is not intended to replace advice given  to you by your health care provider. Make sure you discuss any questions you have with your health care provider. Document Revised: 07/31/2020 Document Reviewed: 07/31/2020 Elsevier Patient Education  2024 [employer].

## 2024-02-11 NOTE — Assessment & Plan Note (Signed)
 Abdominal obesity with visceral fat contributing to metabolic risk. Current weight is 201 lbs, same as last year. Discussed dietary modifications and exercise to reduce visceral fat. Recommended low-carb diet with cauliflower rice as a substitute. Encouraged bodybuilding exercises to increase muscle mass and burn fat.

## 2024-02-12 ENCOUNTER — Ambulatory Visit: Payer: Self-pay | Admitting: Internal Medicine

## 2024-02-12 LAB — TSH+FREE T4: TSH W/REFLEX TO FT4: 3.35 m[IU]/L (ref 0.40–4.50)

## 2024-02-12 LAB — HEMOGLOBIN A1C: Hgb A1c MFr Bld: 5.2 % (ref 4.6–6.5)

## 2024-02-12 NOTE — Progress Notes (Signed)
 Reviewed, all essentially normal except cholesterol.  Message shared to patient.

## 2024-03-29 ENCOUNTER — Other Ambulatory Visit: Payer: Self-pay

## 2024-03-29 ENCOUNTER — Encounter: Payer: Self-pay | Admitting: Internal Medicine

## 2024-03-29 ENCOUNTER — Ambulatory Visit: Payer: Self-pay

## 2024-03-29 MED ORDER — LEVOTHYROXINE SODIUM 88 MCG PO TABS: 88.0000 ug | ORAL_TABLET | Freq: Every day | ORAL | 3 refills | Status: AC

## 2024-03-29 NOTE — Telephone Encounter (Signed)
 FYI Only or Action Required?: FYI only for provider: appointment scheduled on 1/6.  Patient was last seen in primary care on 02/11/2024 by Jesus Bernardino MATSU, MD.  Called Nurse Triage reporting Bloated.  Symptoms began yesterday.  Interventions attempted: OTC medications: gas pills.  Symptoms are: unchanged.  Triage Disposition: See PCP When Office is Open (Within 3 Days)  Patient/caregiver understands and will follow disposition?: Yes, will follow disposition  Copied from CRM [id number]. Topic: Clinical - Red Word Triage >> Mar 29, 2024 12:41 PM Robert Macias wrote: Red Word that prompted transfer to Nurse Triage: The patient is experiencing abdominal bloating and pain. Symptoms began yesterday Reason for Disposition  [1] MILD SWELLING of abdomen (e.g., looks distended or swollen) AND [2] new-onset or getting worse  Answer Assessment - Initial Assessment Questions 1. SYMPTOM: What's the main symptom you're concerned about? (e.g., abdomen bloating, swelling)     bloating 2. ONSET: When did bloating  start?     yesterday 3. SEVERITY: How bad is the bloating or swelling?    moderate 4. ABDOMEN PAIN:  Is there any abdomen pain? If Yes, ask: How bad is the pain?  (e.g., Scale 0-10; or none, mild, moderate, or severe)     4 5. RELIEVING AND AGGRAVATING FACTORS: What makes it better or worse? (e.g., certain foods, lactose, medicines)     Gas pills did not help 6. GI HISTORY: Do you have any history of stomach or intestine problems? (e.g., bowel obstruction, cancer, irritable bowel)      denies 7. CAUSE: What do you think is causing the bloating?      unsure 8. OTHER SYMPTOMS: Do you have any other symptoms? (e.g., belching, blood in stool, breathing difficulty, constipation, diarrhea, fever, passing gas, vomiting, weight loss, white of eyes have turned yellow)     Denies N/V/D, still eating, denies fever  Protocols used: Abdomen Bloating and Swelling-A-AH

## 2024-03-30 ENCOUNTER — Ambulatory Visit: Admitting: Family Medicine

## 2025-02-11 ENCOUNTER — Encounter: Admitting: Internal Medicine
